# Patient Record
Sex: Male | Born: 1990 | Race: Black or African American | Hispanic: No | Marital: Single | Smoking: Current every day smoker
Health system: Southern US, Community
[De-identification: ages and names within clinical notes are randomized; demographics above are authoritative.]

## PROBLEM LIST (undated history)

## (undated) DIAGNOSIS — F32A Depression, unspecified: Secondary | ICD-10-CM

## (undated) DIAGNOSIS — F419 Anxiety disorder, unspecified: Secondary | ICD-10-CM

## (undated) DIAGNOSIS — F329 Major depressive disorder, single episode, unspecified: Secondary | ICD-10-CM

## (undated) HISTORY — DX: Anxiety disorder, unspecified: F41.9

---
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
## 2010-11-29 ENCOUNTER — Emergency Department (HOSPITAL_COMMUNITY): Payer: PRIVATE HEALTH INSURANCE

## 2010-11-29 ENCOUNTER — Emergency Department (HOSPITAL_COMMUNITY)
Admission: EM | Admit: 2010-11-29 | Discharge: 2010-11-30 | Disposition: A | Discharge status: Home / Self Care | Payer: PRIVATE HEALTH INSURANCE | Attending: Emergency Medicine | Admitting: Emergency Medicine

## 2010-11-29 ENCOUNTER — Encounter: Payer: Self-pay | Admitting: *Deleted

## 2010-11-29 DIAGNOSIS — J45909 Unspecified asthma, uncomplicated: Secondary | ICD-10-CM | POA: Insufficient documentation

## 2010-11-29 DIAGNOSIS — R109 Unspecified abdominal pain: Secondary | ICD-10-CM | POA: Insufficient documentation

## 2010-11-29 DIAGNOSIS — K5900 Constipation, unspecified: Secondary | ICD-10-CM | POA: Insufficient documentation

## 2010-11-29 NOTE — ED Notes (Signed)
Patient transported to X-ray 

## 2010-11-29 NOTE — ED Notes (Signed)
The pt has not had a bm since the middle of September.  He has tried laxatives without relief.  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

## 2010-11-29 NOTE — ED Notes (Signed)
ASSUMED CARE ON PT. INTRODUCED SELF , CALL LIGHT WITHIN REACH , BEDRAILS UP , WARM BLANKETS PROVIDED , WAITING FOR EDP/PA EVALUATION , EXPLAINED PROCESS/DELAY .

## 2010-11-30 MED ORDER — DOCUSATE SODIUM 100 MG PO CAPS: 100.0000 mg | ORAL_CAPSULE | Freq: Two times a day (BID) | ORAL | Status: AC

## 2010-11-30 MED ORDER — MAGNESIUM CITRATE PO SOLN: 296.0000 mL | Freq: Once | ORAL | Status: AC

## 2010-11-30 MED ORDER — POLYETHYLENE GLYCOL 3350 17 GM/SCOOP PO POWD: 17.0000 g | Freq: Two times a day (BID) | ORAL | Status: AC

## 2010-11-30 NOTE — ED Notes (Signed)
MD at bedside. 

## 2010-11-30 NOTE — ED Provider Notes (Signed)
History     CSN: [id number] Arrival date & time: 11/29/2010  6:48 PM   First MD Initiated Contact with Patient 11/29/10 2340      Chief Complaint  Patient presents with  . Abdominal Pain    (Consider location/radiation/quality/duration/timing/severity/associated sxs/prior treatment) HPI Comments: Symptoms were gradual in onset Sx are mild to moderate Sx are gradually getting worse, No associated f/c/n/v.  Has mild abd pain.   Constipation is not a new problem for him.  Admits to small hard stools when he has them.  Deneis rectal bleeding, vomiting or nausea or abd distention.  Patient is a 20 y.o. male presenting with abdominal pain. The history is provided by the patient and a relative.  Abdominal Pain The primary symptoms of the illness include abdominal pain. The primary symptoms of the illness do not include fever, fatigue, shortness of breath, nausea, vomiting, diarrhea, hematemesis, hematochezia or dysuria. Episode onset: several months ago. The onset of the illness was gradual. The problem has been gradually worsening.  Associated with: not using opiates - states has had "bowel problems" since infancy. Additional symptoms associated with the illness include constipation. Symptoms associated with the illness do not include chills, anorexia, diaphoresis, urgency, hematuria, frequency or back pain.    Past Medical History  Diagnosis Date  . Asthma     History reviewed. No pertinent past surgical history.  History reviewed. No pertinent family history.  History  Substance Use Topics  . Smoking status: Current Everyday Smoker  . Smokeless tobacco: Not on file  . Alcohol Use: Yes      Review of Systems  Constitutional: Negative for fever, chills, diaphoresis and fatigue.  Respiratory: Negative for shortness of breath.   Gastrointestinal: Positive for abdominal pain and constipation. Negative for nausea, vomiting, diarrhea, hematochezia, anorexia and hematemesis.    Genitourinary: Negative for dysuria, urgency, frequency and hematuria.  Musculoskeletal: Negative for back pain.  All other systems reviewed and are negative.    Allergies  Review of patient's allergies indicates no known allergies.  Home Medications   Current Outpatient Rx  Name Route Sig Dispense Refill  . DOCUSATE SODIUM 100 MG PO CAPS Oral Take 1 capsule (100 mg total) by mouth every 12 (twelve) hours. 30 capsule 0  . MAGNESIUM CITRATE PO SOLN Oral Take 296 mLs by mouth once. OTC 300 mL 0  . POLYETHYLENE GLYCOL 3350 PO POWD Oral Take 17 g by mouth 2 (two) times daily. Until daily soft stools  OTC 255 g 0    BP 127/75  Pulse 69  Temp(Src) 98.1 F (36.7 C) (Oral)  Resp 20  SpO2 97%  Physical Exam  Nursing note and vitals reviewed. Constitutional: He appears well-developed and well-nourished. No distress.  HENT:  Head: Normocephalic and atraumatic.  Mouth/Throat: Oropharynx is clear and moist. No oropharyngeal exudate.  Eyes: Conjunctivae and EOM are normal. Pupils are equal, round, and reactive to light. Right eye exhibits no discharge. Left eye exhibits no discharge. No scleral icterus.  Neck: Normal range of motion. Neck supple. No JVD present. No thyromegaly present.  Cardiovascular: Normal rate, regular rhythm, normal heart sounds and intact distal pulses.  Exam reveals no gallop and no friction rub.   No murmur heard. Pulmonary/Chest: Effort normal and breath sounds normal. No respiratory distress. He has no wheezes. He has no rales.  Abdominal: Soft. He exhibits no distension and no mass. There is tenderness ( bmild L sided ttp, no guarding, non peritoneal, no masses felt, no RUQ or RLQ  ttpl). There is no rebound and no guarding.       BS decreased  Musculoskeletal: Normal range of motion. He exhibits no edema and no tenderness.  Lymphadenopathy:    He has no cervical adenopathy.  Neurological: He is alert. Coordination normal.  Skin: Skin is warm and dry. No  rash noted. No erythema.  Psychiatric: He has a normal mood and affect. His behavior is normal.    ED Course  Procedures (including critical care time)  Labs Reviewed - No data to display Dg Abd 1 View  11/29/2010  *RADIOLOGY REPORT*  Clinical Data: Severe constipation, lower abdominal pain.  ABDOMEN - 1 VIEW  Comparison: None.  Findings: Large stool burden.  Nonobstructive bowel gas pattern. The hemidiaphragms are excluded from the image as are the lung bases.  The visualized organ outlines are normal where seen.  No acute osseous abnormality.  IMPRESSION: Large stool burden.  Nonobstructive bowel gas pattern.  Original Report Authenticated By: Waneta Martins, M.D.     1. Constipation       MDM  Well appearing male with non obstructive bowel gas pattern, and large stool burden on xray, VS normal and well appearing with chronic dysmotility of the bowel.  Has declined rectal exam to alleviate possible disimpaction - agreeable to outpaitne therapy with colace, miralax and magnesium citrate.  F/u instructions given.        Vida Roller, MD 11/30/10 [phone]

## 2015-09-17 ENCOUNTER — Ambulatory Visit
Admission: EM | Admit: 2015-09-17 | Discharge: 2015-09-17 | Disposition: A | Discharge status: Home / Self Care | Payer: Worker's Compensation | Attending: Family Medicine | Admitting: Family Medicine

## 2015-09-17 ENCOUNTER — Encounter: Payer: Self-pay | Admitting: *Deleted

## 2015-09-17 DIAGNOSIS — S61512A Laceration without foreign body of left wrist, initial encounter: Secondary | ICD-10-CM | POA: Diagnosis not present

## 2015-09-17 MED ORDER — TETANUS-DIPHTH-ACELL PERTUSSIS 5-2.5-18.5 LF-MCG/0.5 IM SUSP
0.5000 mL | Freq: Once | INTRAMUSCULAR | Status: AC
  Administered 2015-09-17: 0.5 mL via INTRAMUSCULAR

## 2015-09-17 MED ORDER — LIDOCAINE HCL (PF) 1 % IJ SOLN: 5.0000 mL | Freq: Once | INTRAMUSCULAR | Status: DC

## 2015-09-17 NOTE — ED Provider Notes (Signed)
[facility] ____________________________________________  Time seen: Approximately 4:10 PM  I have reviewed the triage vital signs and the nursing notes.   HISTORY  Chief Complaint  Laceration  HPI Lance Park is a 25 y.o. male presents for the complaint of left wrist laceration. 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 Patient reports he was doing a twisting motion with his left wrist, and his left wrist turned forward causing his medial wrist to hit a piece of metal. Reports this then caused a laceration. Patient reports it was a quick glancing cut. Denies any crush injuries. Patient states mild burning sensation directly at laceration site but denies any other pain or discomfort. Patient reports he is right-handed. Denies any other pain or injury. Denies concerns or foreign bodies. Unsure of last tetanus immunization.  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 Denies fall. Denies head injury or loss of consciousness.   Past Medical History:  Diagnosis Date  . Asthma     There are no active problems to display for this patient.   History reviewed. No pertinent surgical history.    No current facility-administered medications for this encounter.  No current outpatient prescriptions on file.  Allergies Review of patient's allergies indicates no known allergies.  History reviewed. No pertinent family history.  Social History Social History  Substance Use Topics  . Smoking status: Current Every Day Smoker  . Smokeless tobacco: Never Used  . Alcohol use No    Review of Systems Constitutional: No fever/chills Eyes: No visual changes. ENT: No sore throat. Cardiovascular: Denies chest pain. Respiratory: Denies shortness of breath. Gastrointestinal: No abdominal pain.  No nausea, no vomiting.  No diarrhea.  No constipation. Genitourinary: Negative for dysuria. Musculoskeletal: Negative for back pain. Skin:  Negative for rash. Neurological: Negative for headaches, focal weakness or numbness.  10-point ROS otherwise negative.  ____________________________________________   PHYSICAL EXAM:  VITAL SIGNS: ED Triage Vitals  Enc Vitals Group     BP 09/17/15 1556 131/69     Pulse Rate 09/17/15 1556 69     Resp 09/17/15 1556 18     Temp 09/17/15 1556 98.7 F (37.1 C)     Temp Source 09/17/15 1556 Oral     SpO2 09/17/15 1556 100 %     Weight 09/17/15 1559 145 lb (65.8 kg)     Height 09/17/15 1559 5\' 9"  (1.753 m)     Head Circumference --      Peak Flow --      Pain Score 09/17/15 1600 1     Pain Loc --      Pain Edu? --      Excl. in GC? --     Constitutional: Alert and oriented. Well appearing and in no acute distress. Eyes: Conjunctivae are normal. PERRL. EOMI. ENT      Head: Normocephalic and atraumatic.      Mouth/Throat: Mucous membranes are moist. Cardiovascular: Normal rate, regular rhythm. Grossly normal heart sounds.  Good peripheral circulation. Respiratory: Normal respiratory effort without tachypnea nor retractions. Breath sounds are clear and equal bilaterally. No wheezes/rales/rhonchi.. Musculoskeletal:  Ambulatory with steady gait. Neurologic:  Normal speech and language. No gross focal neurologic deficits are appreciated. Speech is normal. No gait instability.  Skin:  Skin is warm, dry and intact. No rash noted. Except: left wrist volar lateral radial aspect 3.5 cm linear laceration present, laceration superficial, no tendon visualized, no active bleeding, nontender, no foreign bodies visualized. Left  hand and wrist with full range of motion,no bony tenderness, no motor or tendon deficit, normal distal capillary refill time, bilateral distal radial pulses equal.  Psychiatric: Mood and affect are normal. Speech and behavior are normal. Patient exhibits appropriate insight and judgment   ___________________________________________   LABS (all labs ordered are listed, but  only abnormal results are displayed)  Labs Reviewed - No data to display ____________________________________________   PROCEDURES Procedures  Procedure(s) performed:  Procedure explained and verbal consent obtained. Consent: Verbal consent obtained. Written consent not obtained. Risks and benefits: risks, benefits and alternatives were discussed Patient identity confirmed: verbally with patient and hospital-assigned identification number  Consent given by: patient   Laceration Repair Location: Left wrist Length: 3.5 cm Foreign bodies: no foreign bodies visualized. Tendon involvement: none Nerve involvement: none Preparation: Patient was prepped and draped in the usual sterile fashion. Anesthesia with 1% Lidocaine 4 mls Irrigation solution: saline and betadine Irrigation method: jet lavage Amount of cleaning: copious Repaired with 4-0 nylon  Number of sutures: 6 Technique: simple interrupted  Approximation: loose Patient tolerate well. Wound well approximated post repair.  Antibiotic ointment and dressing applied.  Wound care instructions provided.  Observe for any signs of infection or other problems.     ____________________________________________   INITIAL IMPRESSION / ASSESSMENT AND PLAN / ED COURSE  Pertinent labs & imaging results that were available during my care of the patient were reviewed by me and considered in my medical decision making (see chart for details).  Well-appearing patient. No acute distress. Presents for the complaints of left wrist laceration sustained just prior to arrival, at work. Reports mechanical injury. No bony tenderness. Wound cleaned and repaired. Patient tolerated well. Tetanus immunization updated. Patient directed in wound care and wound monitoring. Directed to return to urgent care in 7-10 days for suture removal.  Discussed follow up with Primary care physician this week. Discussed follow up and return parameters including no  resolution or any worsening concerns. Patient verbalized understanding and agreed to plan.   ____________________________________________   FINAL CLINICAL IMPRESSION(S) / ED DIAGNOSES  Final diagnoses:  Laceration of left wrist, initial encounter     New Prescriptions   No medications on file    Note: This dictation was prepared with Dragon dictation along with smaller phrase technology. Any transcriptional errors that result from this process are unintentional.    Clinical Course      Renford Dills, NP 09/17/15 1700

## 2015-09-17 NOTE — Discharge Instructions (Signed)
Keep clean and dry. Clean daily with soap and water as discussed.   Return in urgent care in 7-10 days for suture removal.Return sooner for any redness, swelling, pain or other concerns.    Follow up with your primary care physician this week as needed. Return to Urgent care for new or worsening concerns.

## 2015-09-17 NOTE — ED Triage Notes (Signed)
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

## 2015-09-26 ENCOUNTER — Encounter: Payer: Self-pay | Admitting: *Deleted

## 2015-09-26 ENCOUNTER — Ambulatory Visit
Admission: EM | Admit: 2015-09-26 | Discharge: 2015-09-26 | Disposition: A | Discharge status: Home / Self Care | Payer: Worker's Compensation

## 2015-09-26 NOTE — ED Triage Notes (Signed)
Patient has arrived for suture removal. Laceration is well healed with no redness, swelling, or drainage.  6 sutures removed.

## 2016-07-07 ENCOUNTER — Encounter (HOSPITAL_COMMUNITY): Payer: Self-pay | Admitting: *Deleted

## 2016-07-07 ENCOUNTER — Inpatient Hospital Stay (HOSPITAL_COMMUNITY)
Admission: AD | Admit: 2016-07-07 | Discharge: 2016-07-12 | DRG: 885 | Disposition: A | Discharge status: Home / Self Care | Payer: Federal, State, Local not specified - Other | Source: Intra-hospital | Attending: Psychiatry | Admitting: Psychiatry

## 2016-07-07 ENCOUNTER — Emergency Department (HOSPITAL_COMMUNITY)
Admission: EM | Admit: 2016-07-07 | Discharge: 2016-07-07 | Disposition: A | Discharge status: Other Acute Inpatient Hospital | Payer: Self-pay | Attending: Emergency Medicine | Admitting: Emergency Medicine

## 2016-07-07 ENCOUNTER — Encounter (HOSPITAL_COMMUNITY): Payer: Self-pay

## 2016-07-07 DIAGNOSIS — Z915 Personal history of self-harm: Secondary | ICD-10-CM

## 2016-07-07 DIAGNOSIS — F322 Major depressive disorder, single episode, severe without psychotic features: Secondary | ICD-10-CM | POA: Insufficient documentation

## 2016-07-07 DIAGNOSIS — Z79899 Other long term (current) drug therapy: Secondary | ICD-10-CM | POA: Insufficient documentation

## 2016-07-07 DIAGNOSIS — F17210 Nicotine dependence, cigarettes, uncomplicated: Secondary | ICD-10-CM | POA: Diagnosis present

## 2016-07-07 DIAGNOSIS — R45851 Suicidal ideations: Secondary | ICD-10-CM | POA: Diagnosis not present

## 2016-07-07 DIAGNOSIS — F1290 Cannabis use, unspecified, uncomplicated: Secondary | ICD-10-CM | POA: Diagnosis present

## 2016-07-07 DIAGNOSIS — F411 Generalized anxiety disorder: Secondary | ICD-10-CM | POA: Insufficient documentation

## 2016-07-07 DIAGNOSIS — G4700 Insomnia, unspecified: Secondary | ICD-10-CM | POA: Diagnosis not present

## 2016-07-07 DIAGNOSIS — F17200 Nicotine dependence, unspecified, uncomplicated: Secondary | ICD-10-CM | POA: Insufficient documentation

## 2016-07-07 DIAGNOSIS — J45909 Unspecified asthma, uncomplicated: Secondary | ICD-10-CM | POA: Insufficient documentation

## 2016-07-07 HISTORY — DX: Depression, unspecified: F32.A

## 2016-07-07 HISTORY — DX: Major depressive disorder, single episode, unspecified: F32.9

## 2016-07-07 LAB — RAPID URINE DRUG SCREEN, HOSP PERFORMED
Amphetamines: NOT DETECTED
Barbiturates: NOT DETECTED
Benzodiazepines: NOT DETECTED
Cocaine: NOT DETECTED
Opiates: NOT DETECTED
Tetrahydrocannabinol: POSITIVE — AB

## 2016-07-07 LAB — ACETAMINOPHEN LEVEL: Acetaminophen (Tylenol), Serum: <10 ug/mL — ABNORMAL LOW (ref 10–30)

## 2016-07-07 LAB — COMPREHENSIVE METABOLIC PANEL
ALT: 24 U/L (ref 17–63)
AST: 23 U/L (ref 15–41)
Albumin: 4.5 g/dL (ref 3.5–5.0)
Alkaline Phosphatase: 76 U/L (ref 38–126)
Anion gap: 6 (ref 5–15)
BUN: 10 mg/dL (ref 6–20)
CO2: 27 mmol/L (ref 22–32)
Calcium: 9 mg/dL (ref 8.9–10.3)
Chloride: 105 mmol/L (ref 101–111)
Creatinine, Ser: 0.99 mg/dL (ref 0.61–1.24)
GFR calc Af Amer: >60 mL/min (ref >60)
GFR calc non Af Amer: >60 mL/min (ref >60)
Glucose, Bld: 95 mg/dL (ref 65–99)
Potassium: 4.1 mmol/L (ref 3.5–5.1)
Sodium: 138 mmol/L (ref 135–145)
Total Bilirubin: 0.6 mg/dL (ref 0.3–1.2)
Total Protein: 7.6 g/dL (ref 6.5–8.1)

## 2016-07-07 LAB — CBC
HCT: 44.1 % (ref 39.0–52.0)
Hemoglobin: 15.2 g/dL (ref 13.0–17.0)
MCH: 26.2 pg (ref 26.0–34.0)
MCHC: 34.5 g/dL (ref 30.0–36.0)
MCV: 75.9 fL — ABNORMAL LOW (ref 78.0–100.0)
Platelets: 211 10*3/uL (ref 150–400)
RBC: 5.81 MIL/uL (ref 4.22–5.81)
RDW: 13.7 % (ref 11.5–15.5)
WBC: 6.9 10*3/uL (ref 4.0–10.5)

## 2016-07-07 LAB — ETHANOL: Alcohol, Ethyl (B): <5 mg/dL (ref <5)

## 2016-07-07 LAB — SALICYLATE LEVEL: Salicylate Lvl: <7 mg/dL (ref 2.8–30.0)

## 2016-07-07 MED ORDER — ACETAMINOPHEN 325 MG PO TABS: 650.0000 mg | ORAL_TABLET | Freq: Four times a day (QID) | ORAL | Status: DC | PRN

## 2016-07-07 MED ORDER — HYDROXYZINE HCL 25 MG PO TABS
25.0000 mg | ORAL_TABLET | Freq: Three times a day (TID) | ORAL | Status: DC | PRN
  Filled 2016-07-07 (×2): qty 1

## 2016-07-07 MED ORDER — ALUM & MAG HYDROXIDE-SIMETH 200-200-20 MG/5ML PO SUSP: 30.0000 mL | ORAL | Status: DC | PRN

## 2016-07-07 MED ORDER — MAGNESIUM HYDROXIDE 400 MG/5ML PO SUSP: 30.0000 mL | Freq: Every day | ORAL | Status: DC | PRN

## 2016-07-07 MED ORDER — NICOTINE POLACRILEX 2 MG MT GUM: 2.0000 mg | CHEWING_GUM | OROMUCOSAL | Status: DC | PRN

## 2016-07-07 MED ORDER — TRAZODONE HCL 50 MG PO TABS
50.0000 mg | ORAL_TABLET | Freq: Every evening | ORAL | Status: DC | PRN
  Filled 2016-07-07: qty 1

## 2016-07-07 NOTE — Progress Notes (Signed)
Lance Park is a 26 year old male being admitted voluntarily to [facility] from [facility].  He came for evaluation for suicidal ideation. His ex-girlfriend noticed that he was googling lethal doses of oxycodone and then found his mother prescription in his pocket.  She called mobile crisis for evaluation.  Recent stressors are loss of his job about 2-3 weeks ago, finances, and a breakup with his girlfriend. He reported that he "feels that he has a burden on everyone in his life and that he would be better off dead". He denied HI or A/V hallucinations. He has a history of 2 suicide attempts in the past.  He reported also feeling anxiety with panic attacks at least 2-3 times per week.  He denies any medical issues.  He is diagnosed with Generalized Anxiety Disorder, MDD without psychotic features.  He denies any SI and will contract for safety on the unit.  He also denies HI or A/V hallucinations. Oriented him to the unit.  Admission paperwork completed and signed.  Belongings searched and secured in locker # 26.  Skin assessment completed and no skin issues noted except multiple tattoos.  Q 15 minute checks initiated for safety.  We will monitor the progress towards his goals.

## 2016-07-07 NOTE — ED Notes (Signed)
Pt calm and cooperative. Patient belonging given to [facility] transportation personnel.

## 2016-07-07 NOTE — Progress Notes (Signed)
Wrier introduced self to Pt. Pt. is seen in room reading a handout. Pt. appears anxious but is cooperative with assessment. Pt. denies SI/HI/AVH/Pain. PRNs are not needed at this time. Fluids and snacks offered, Pt. refused. Will follow POC.
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
## 2016-07-07 NOTE — ED Notes (Signed)
Bed: [facility] Expected date:  Expected time:  Means of arrival:  Comments: 

## 2016-07-07 NOTE — Progress Notes (Signed)
Pt did not attend group. 

## 2016-07-07 NOTE — ED Notes (Signed)
Pt admitted to room #39. Pt pleasant on approach. Pt reports "depression and anxiety" brought him to the hospital. Pt currently denies SI/HI/AVH. Pt identifies recent loss of a job and finances as current stressors. Pt expressing hopelessness. Encouragement and support provided. Special checks q 15 mins in place for safety, Video monitoring in place. Will continue to monitor.

## 2016-07-07 NOTE — ED Notes (Signed)
Pt calm and cooperative on approach. Pt reports history of depression but has never taken any medication. Pt did reports seeing a counselor but not recently. Pt reports stressor as recent job loss. Pt does minimize the reason he is here and reports he is not open to taking medications. Pt denies SI/HI/AVH and pain.

## 2016-07-07 NOTE — Tx Team (Signed)
Initial Treatment Plan 07/07/2016 10:05 PM Lance L Holsworth [id number]    PATIENT STRESSORS: Financial difficulties Loss of Job Substance abuse   PATIENT STRENGTHS: Wellsite geologistCommunication skills General fund of knowledge Motivation for treatment/growth Physical Health Supportive family/friends   PATIENT IDENTIFIED PROBLEMS: Depression  Anxiety  Suicidal ideation  "Learn how to deal with my depression and anxiety"  "Learn some coping skills"             DISCHARGE CRITERIA:  Improved stabilization in mood, thinking, and/or behavior Motivation to continue treatment in a less acute level of care Verbal commitment to aftercare and medication compliance  PRELIMINARY DISCHARGE PLAN: Outpatient therapy Medication management  PATIENT/FAMILY INVOLVEMENT: This treatment plan has been presented to and reviewed with the patient, Lance Park.  The patient and family have been given the opportunity to ask questions and make suggestions.  Levin BaconHeather V Maurice Ramseur, RN 07/07/2016, 10:05 PM
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
## 2016-07-07 NOTE — ED Triage Notes (Signed)
Patient is alert and oriented x4.  He is being seen for increasing SI.  Patient friend called crisis hotline and the patient was given the choice of comeing to the ED voluntary or involuntary.  Patient is cooperative.  Patient friend states that the patient has always been dealing with depression and his mood fluctuates.  Currently he denies any pain.

## 2016-07-08 DIAGNOSIS — F17210 Nicotine dependence, cigarettes, uncomplicated: Secondary | ICD-10-CM

## 2016-07-08 DIAGNOSIS — F322 Major depressive disorder, single episode, severe without psychotic features: Principal | ICD-10-CM

## 2016-07-08 DIAGNOSIS — R45851 Suicidal ideations: Secondary | ICD-10-CM

## 2016-07-08 DIAGNOSIS — G4700 Insomnia, unspecified: Secondary | ICD-10-CM

## 2016-07-08 LAB — LIPID PANEL
Cholesterol: 180 mg/dL (ref 0–200)
HDL: 48 mg/dL (ref >40)
LDL Cholesterol: 118 mg/dL — ABNORMAL HIGH (ref 0–99)
Total CHOL/HDL Ratio: 3.8 RATIO
Triglycerides: 69 mg/dL (ref <150)
VLDL: 14 mg/dL (ref 0–40)

## 2016-07-08 LAB — TSH: TSH: 1.449 u[IU]/mL (ref 0.350–4.500)

## 2016-07-08 MED ORDER — CITALOPRAM HYDROBROMIDE 10 MG PO TABS
10.0000 mg | ORAL_TABLET | Freq: Every day | ORAL | Status: DC
  Administered 2016-07-08 – 2016-07-09 (×2): 10 mg via ORAL
  Filled 2016-07-08 (×5): qty 1

## 2016-07-08 NOTE — [facility] Suicide Risk Assessment (Addendum)
[facility] INPATIENT:  Family/Significant Other Suicide Prevention Education  Suicide Prevention Education:  Education Completed; Lance Park, Pt's ex-girlfriend [phone], has been identified by the patient as the family member/significant other with whom the patient will be residing, and identified as the person(s) who will aid the patient in the event of a mental health crisis (suicidal ideations/suicide attempt).  With written consent from the patient, the family member/significant other has been provided the following suicide prevention education, prior to the and/or following the discharge of the patient.  The suicide prevention education provided includes the following:  Suicide risk factors  Suicide prevention and interventions  National Suicide Hotline telephone number  [facility] assessment telephone number  [facility] Emergency Assistance 911  [facility] and/or Residential Mobile Crisis Unit telephone number  Request made of family/significant other to:  Remove weapons (e.g., guns, rifles, knives), all items previously/currently identified as safety concern.    Remove drugs/medications (over-the-counter, prescriptions, illicit drugs), all items previously/currently identified as a safety concern.  The family member/significant other verbalizes understanding of the suicide prevention education information provided.  The family member/significant other agrees to remove the items of safety concern listed above.  Pt's ex-girlfriend reports being supportive of Pt, however feels that Pt is often manipulative when his anxiety and depression symptoms are high as he is unable to cope in healthy ways.   Lance Park 07/08/2016, 2:23 PM
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
## 2016-07-08 NOTE — Progress Notes (Signed)
  Data. Patient denies SI/HI/AVH. Patient interacting well with staff and other patients. Affect is bright with staff and peers. He is a little hesitant/cautious with staff. He states, "I really want to get the help I need and then get out of here." Patient did state, "I don't really know why I am here, I don't think I need to be", so some ambivalence noted. We did discuss the medication the doctor had started him on and he received a handout. Also began to explore ways of changing thought patterns. Action. Emotional support and encouragement offered. Education provided on medication, indications and side effect. Q 15 minute checks done for safety. Response. Safety on the unit maintained through 15 minute checks.  Medications taken as prescribed. Attended groups. Remained calm and appropriate through out shift.

## 2016-07-08 NOTE — Progress Notes (Signed)
Recreation Therapy Notes  Date: 07/08/16 Time: 0930 Location: 300 Hall Dayroom  Group Topic: Stress Management  Goal Area(s) Addresses:  Patient will verbalize importance of using healthy stress management.  Patient will identify positive emotions associated with healthy stress management.   Intervention: Stress Management  Activity :  Progressive Muscle Relaxation.  LRT introduced the stress management technique of progressive muscle relaxation.  LRT read Park script that guided patients through steps of tensing and relaxing each muscle group.  Patients were to follow along as LRT read script to fully participate in the technique.  Education:  Stress Management, Discharge Planning.   Education Outcome: Acknowledges edcuation/In group clarification offered/Needs additional education  Clinical Observations/Feedback: Pt did not attend group.   Jazlyne Gauger, LRT/CTRS         Lance Park 07/08/2016 11:27 AM 

## 2016-07-08 NOTE — Plan of Care (Signed)
Problem: Activity: Goal: Imbalance in normal sleep/wake cycle will improve Outcome: Progressing Pateint reports, "Slet well last night", And has not napped during the day.
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
## 2016-07-08 NOTE — Plan of Care (Signed)
Problem: Coping: Goal: Ability to demonstrate self-control will improve Outcome: Progressing PAtient has remained calm and appropriate in behavior, throughout shift.
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
## 2016-07-08 NOTE — [facility] Group Notes (Signed)
[facility] LCSW Group Therapy 07/08/2016 1:15pm  Type of Therapy: Group Therapy- Feelings Around Relapse and Recovery  Participation Level: Active   Participation Quality:  Appropriate  Affect:  Appropriate  Cognitive: Alert and Oriented   Insight:  Developing   Engagement in Therapy: Developing/Improving and Engaged   Modes of Intervention: Clarification, Confrontation, Discussion, Education, Exploration, Limit-setting, Orientation, Problem-solving, Rapport Building, Dance movement psychotherapisteality Testing, Socialization and Support  Summary of Progress/Problems: The topic for today was feelings about relapse. The group discussed what relapse prevention is to them and identified triggers that they are on the path to relapse. Members also processed their feeling towards relapse and were able to relate to common experiences. Group also discussed coping skills that can be used for relapse prevention. Pt states that he struggles with smoking weed as a way to self medicate his depression and anxiety. Pt states that part of his relapse prevention plan is being put on the right medications so that he will no longer have to self medicate with weed.   Therapeutic Modalities:   Cognitive Behavioral Therapy Solution-Focused Therapy Assertiveness Training Relapse Prevention Therapy    Donnelly StagerLynn Jabriel Vanduyne, MSW, Theresia MajorsLCSWA [phone] 07/08/2016 3:04 PM

## 2016-07-09 DIAGNOSIS — F1290 Cannabis use, unspecified, uncomplicated: Secondary | ICD-10-CM

## 2016-07-09 LAB — HEMOGLOBIN A1C
Hgb A1c MFr Bld: 5 % (ref 4.8–5.6)
Mean Plasma Glucose: 97 mg/dL

## 2016-07-09 MED ORDER — CITALOPRAM HYDROBROMIDE 20 MG PO TABS
20.0000 mg | ORAL_TABLET | Freq: Every day | ORAL | Status: DC
  Administered 2016-07-10 – 2016-07-12 (×3): 20 mg via ORAL
  Filled 2016-07-09 (×4): qty 1
  Filled 2016-07-09: qty 7

## 2016-07-09 NOTE — Progress Notes (Signed)
Patient attended group and said that his day was a 9. His coping skills were coloring, exercising and taking showers.

## 2016-07-09 NOTE — Progress Notes (Signed)
Writer has observed patient up in the dayroom  Off and on tonight interacting with select peers. He was also seen with Herbert SetaHeather A doing exercises up and down the hallway. He reports that he had energy that he needed to burn off. He reports that his day was ok until it was visitation time and no one came to see him. He reports that his family feels that he is pretending and that their is nothing wrong with him. Writer encouraged him to focus on himself and getting the help he needs. Support given and safety maintained on unit with 15 min checks.

## 2016-07-09 NOTE — Plan of Care (Signed)
Problem: Activity: Goal: Interest or engagement in leisure activities will improve Outcome: Progressing Patient hs attended every unit activity this shift.

## 2016-07-09 NOTE — Progress Notes (Signed)
Data. Patient denies SI/HI/AVH. Patient is, however, reporting he has increased depression this AM, "Because no one came to visit me last night. They just think that I am making this up. They don't understand." patient did talk at length about his career plans and traveling the world, "To see how other people live."  Patient interacting well with staff and other patients. Affect was very flat at the beginning of the shift, but has brightened as the day has advanced.  Action. Emotional support and encouragement offered. Education provided on medication, indications and side effect. Q 15 minute checks done for safety. Response. Safety on the unit maintained through 15 minute checks.  Medications taken as prescribed. Attended groups. Remained calm and appropriate through out shift.

## 2016-07-09 NOTE — Plan of Care (Signed)
Problem: Coping: Goal: Ability to cope will improve Outcome: Progressing Patient was able to be angry, this shift, and maintain appropriate behavior, and have SI.

## 2016-07-09 NOTE — [facility] Group Notes (Signed)
Adult Therapy Group Note (Clinical Social Work)  Date:  07/09/2016  Time:  10:00-11:00AM  Group Topic/Focus:  HEALTHY COPING SKILLS  Today's group focused on identifying healthy coping skills already in use by each patient, as well as healthy coping skills they would like to learn.  There was much sharing, support, psychoeducation, and encouragement provided.  Participation Level:  Minimal  Participation Quality:  Attentive  Affect:  Appropriate  Cognitive:  Alert  Insight: Improving  Engagement in Group:  Limited  Modes of Intervention:  Discussion, Support and Processing  Additional Comments:  The patient shared little in group, was in and out a number of times without explanation, listened but did not talk hardly at all.  Carloyn JaegerMareida J Grossman-Orr 06/11/2016, 1:28 PM

## 2016-07-09 NOTE — Progress Notes (Signed)
[facility]  07/09/2016 11:14 AM [name] Rod[place]  MRN:  [id number] Subjective:  Patient reports "I am feeling frustrated today, I feel like I am ready to leave."  Objective: [name] Coll is awake, alert and oriented. Patient reports attending group session. Reports he is feeling ready to discharge and states he has been here for the past 2 day." I am not feeling too high or too low.".  Denies suicidal or homicidal ideation. Denies auditory or visual hallucination and does not appear to be responding to internal stimuli. Patient interacts well with staff and others. Patient reports he is medication compliant with the Celexa without side effects.   Reports good appetite and reports he is resting well. Support, encouragement and reassurance was provided.   Principal Problem: Severe major depression without psychotic features (HCC) Diagnosis:   Patient Active Problem List   Diagnosis Date Noted  . Severe major depression without psychotic features (HCC) [F32.2] 07/07/2016   Total Time spent with patient: 30 minutes  Past Psychiatric History:   Past Medical History:  Past Medical History:  Diagnosis Date  . Asthma    "As a child, I grew out of it"  . Depression    History reviewed. No pertinent surgical history. Family History: History reviewed. No pertinent family history. Family Psychiatric  History:  Social History:  History  Alcohol Use No     History  Drug Use  . Types: Marijuana    Social History   Social History  . Marital status: Single    Spouse name: N/A  . Number of children: N/A  . Years of education: N/A   Social History Main Topics  . Smoking status: Current Every Day Smoker    Packs/day: 0.50    Types: Cigarettes  . Smokeless tobacco: Never Used  . Alcohol use No  . Drug use: Yes    Types: Marijuana  . Sexual activity: Yes   Other Topics Concern  . None   Social History Narrative  . None   Additional Social History:    Pain Medications:  denies Prescriptions: denies Over the Counter: denies History of alcohol / drug use?: Yes Name of Substance 1: marijuana 1 - Age of First Use: 14 1 - Amount (size/oz): 1 blunt or 2 1 - Frequency: daily 1 - Duration: 10 years 1 - Last Use / Amount: last night                  Sleep: Fair  Appetite:  Fair  Current Medications: Current Facility-Administered Medications  Medication Dose Route Frequency Provider Last Rate Last Dose  . acetaminophen (TYLENOL) tablet 650 mg  650 mg Oral Q6H PRN Jackelyn PolingBerry, Jason A, NP      . alum & mag hydroxide-simeth (MAALOX/MYLANTA) 200-200-20 MG/5ML suspension 30 mL  30 mL Oral Q4H PRN Nira ConnBerry, Jason A, NP      . citalopram (CELEXA) tablet 10 mg  10 mg Oral Daily Cobos, Rockey SituFernando A, MD   10 mg at 07/09/16 [id number]  . hydrOXYzine (ATARAX/VISTARIL) tablet 25 mg  25 mg Oral TID PRN Jackelyn PolingBerry, Jason A, NP      . magnesium hydroxide (MILK OF MAGNESIA) suspension 30 mL  30 mL Oral Daily PRN Nira ConnBerry, Jason A, NP      . nicotine polacrilex (NICORETTE) gum 2 mg  2 mg Oral PRN Cobos, Rockey SituFernando A, MD      . traZODone (DESYREL) tablet 50 mg  50 mg Oral QHS PRN Jackelyn PolingBerry, Jason A,  NP        Lab Results:  Results for orders placed or performed during the hospital encounter of 07/07/16 (from the past 48 hour(s))  Hemoglobin A1c     Status: None   Collection Time: 07/08/16  6:08 AM  Result Value Ref Range   Hgb A1c MFr Bld 5.0 4.8 - 5.6 %    Comment: (NOTE)         Pre-diabetes: 5.7 - 6.4         Diabetes: >6.4         Glycemic control for adults with diabetes: <7.0    Mean Plasma Glucose 97 mg/dL    Comment: (NOTE) Performed At: [facility] [address] [place], [place] [id number] Mila Homer MD [id number] Performed at [facility], [address], [place], [place] [address]   Lipid panel     Status: Abnormal   Collection Time: 07/08/16  6:08 AM  Result Value Ref Range   Cholesterol 180 0 - 200 mg/dL   Triglycerides 69 <308  mg/dL   HDL 48 >65 mg/dL   Total CHOL/HDL Ratio 3.8 RATIO   VLDL 14 0 - 40 mg/dL   LDL Cholesterol 784 (H) 0 - 99 mg/dL    Comment:        Total Cholesterol/HDL:CHD Risk Coronary Heart Disease Risk Table                     Men   Women  1/2 Average Risk   3.4   3.3  Average Risk       5.0   4.4  2 X Average Risk   9.6   7.1  3 X Average Risk  23.4   11.0        Use the calculated Patient Ratio above and the CHD Risk Table to determine the patient's CHD Risk.        ATP III CLASSIFICATION (LDL):  <100     mg/dL   Optimal  696-295  mg/dL   Near or Above                    Optimal  130-159  mg/dL   Borderline  284-132  mg/dL   High  >440     mg/dL   Very High Performed at [facility], [address] [place], [place] [address]   TSH     Status: None   Collection Time: 07/08/16  6:08 AM  Result Value Ref Range   TSH 1.449 0.350 - 4.500 uIU/mL    Comment: Performed by a 3rd Generation assay with a functional sensitivity of <=0.01 uIU/mL. Performed at [facility], [address] [place], [place] [address]     Blood Alcohol level:  Lab Results  Component Value Date   ETH <5 07/07/2016    Metabolic Disorder Labs: Lab Results  Component Value Date   HGBA1C 5.0 07/08/2016   MPG 97 07/08/2016   No results found for: PROLACTIN Lab Results  Component Value Date   CHOL 180 07/08/2016   TRIG 69 07/08/2016   HDL 48 07/08/2016   CHOLHDL 3.8 07/08/2016   VLDL 14 07/08/2016   LDLCALC 118 (H) 07/08/2016    Physical Findings: AIMS: Facial and Oral Movements Muscles of Facial Expression: None, normal Lips and Perioral Area: None, normal Jaw: None, normal Tongue: None, normal,Extremity Movements Upper (arms, wrists, hands, fingers): None, normal Lower (legs, knees, ankles, toes): None,  normal, Trunk Movements Neck, shoulders, hips: None, normal, Overall Severity Severity of abnormal movements (highest score from questions above): None,  normal Incapacitation due to abnormal movements: None, normal [name] awareness of abnormal movements (rate only patient's report): No Awareness, Dental Status Current problems with teeth and/or dentures?: No Does patient usually wear dentures?: No  CIWA:    COWS:     Musculoskeletal: Strength & Muscle Tone: within normal limits Gait & Station: normal Patient leans: N/A  Psychiatric Specialty Exam: Physical Exam  Vitals reviewed. Constitutional: He is oriented to person, place, and time. He appears well-developed.  HENT:  Head: Normocephalic.  Cardiovascular: Normal rate.   Neurological: He is alert and oriented to person, place, and time.  Psychiatric: He has a normal mood and affect. His behavior is normal.    Review of Systems  Psychiatric/Behavioral: Negative for depression (stable). The patient is not nervous/anxious (stable).     Blood pressure 120/80, pulse 69, temperature 98.3 F (36.8 C), temperature source Oral, resp. rate 18, height 5' 8.5" (1.74 m), weight 63 kg (139 lb).Body mass index is 20.83 kg/m.  General Appearance: Casual  Eye Contact:  Good  Speech:  Clear and Coherent  Volume:  Normal  Mood:  Depressed and Irritable  Affect:  Congruent and Depressed  Thought Process:  Coherent  Orientation:  Full (Time, Place, and Person)  Thought Content:  Hallucinations: None and Rumination  Suicidal Thoughts:  No  Homicidal Thoughts:  No  Memory:  Immediate;   Fair Recent;   Fair Remote;   Fair  Judgement:  Fair  Insight:  Fair  Psychomotor Activity:  Normal  Concentration:  Concentration: Good  Recall:  Fair  Fund of Knowledge:  Fair  Language:  Fair  Akathisia:  No  Handed:  Right  AIMS (if indicated):     Assets:  Communication Skills Desire for Improvement Resilience Social Support  ADL's:  Intact  Cognition:  WNL  Sleep:  Number of Hours: 6.75     I agree with current treatment plan on 07/09/2016, Patient seen face-to-face for psychiatric  evaluation follow-up, chart reviewed. Reviewed the information documented and agree with the treatment plan.  Treatment Plan Summary: Daily contact with patient to assess and evaluate symptoms and progress in treatment and Medication management   Increased with Celexa 10 mg  for mood stabilization. Continue with Trazodone 50 mg for insomnia  Will continue to monitor vitals ,medication compliance and treatment side effects while patient is here.  Reviewed labs: ,BAL - 0, UDS - pos for Thc  CSW will start working on disposition.  Patient to participate in therapeutic milieu  Oneta Rack, NP 07/09/2016, 11:14 AM  Notes reviewed and agree with plan

## 2016-07-09 NOTE — [facility] Group Notes (Signed)
[facility] Group Notes:  (Nursing/MHT/Case Management/Adjunct)  Date:  07/09/2016  Time:  5:58 PM  Type of Therapy:  Nurse Education  Participation Level:  Active  Participation Quality:  Appropriate  Affect:  Angry and Anxious  Cognitive:  Alert and Appropriate  Insight:  Appropriate  Engagement in Group:  Engaged  Modes of Intervention:  Discussion  Summary of Progress/Problems:   Group addressed coping skills related to depression and anxiety, specifically dealing with stressful situations.    Almira Barenny G Aljean Horiuchi 07/09/2016, 5:58 PM

## 2016-07-10 NOTE — Progress Notes (Signed)
D: Spoke with patient 1:1 who is up and visible in the milieu. He is participating in treatment and attending group therapies. He is observed interacting appropriately with staff and peers. [name] affect is animated with anxious but pleasant mood. Per his self inventory and through discussion with this writer, patient rates depression, hopelessness and anxiety all at a 0/10. Rates sleep as good, appetite as good, energy as normal and concentration as good.  States goal for today is to "keep my mind quiet during down time, work on word puzzles, tv, color, draw." Denies pain, physical problems.   A: Medicated per orders, no prns requested or required. Level III obs in place for safety. Emotional support offered and self inventory reviewed. Patient completed Suicide Safety Plan which was reviewed by this Clinical research associatewriter and is filed on chart. Will be given a copy upon discharge. Encouraged programming participation.    R: Patient verbalizes understanding of POC. Compliant with celexa and reports improvement. Lance Park denies SI/HI/AVH and remains safe on level III obs. Will continue to monitor closely and make verbal contact frequently.

## 2016-07-10 NOTE — Progress Notes (Signed)
  Adult Psychoeducational Group Note  Date:  07/10/2016 Time:  10:21 PM  Group Topic/Focus:  Wrap-Up Group:   The focus of this group is to help patients review their daily goal of treatment and discuss progress on daily workbooks.  Participation Level:  Active  Participation Quality:  Appropriate, Attentive, Sharing and Supportive  Affect:  Appropriate  Cognitive:  Appropriate  Insight: Appropriate and Good  Engagement in Group:  Developing/Improving  Modes of Intervention:  Discussion, Education, Socialization and Support  Additional Comments:  Pt. Shared in group that his goal is to put himself first. Pt shared he was talking to his girlfriend and has been giving it some space so he doesn't feel overwhelmed.   Karleen HampshireFox, Neyland Pettengill Brittini 07/10/2016, 10:21 PM

## 2016-07-10 NOTE — Progress Notes (Signed)
[facility]  07/10/2016 12:05 PM Lance Park  MRN:  [id number]   Subjective:  Patient reports " I am feeling so much better and I am taking my medication. I really would like to discharge on tomorrow."   Objective: Lance Park seen sitting in the dayroom interacting with peers. Patient continue to report he is improving day by day and states that he is planning to follow-up with [facility]. Patient denies suicidal or homicidal ideations during this assessment. Patient reports feeling better after taking Celexa. Reports he has been attending group sessions. Reports he is eating and sleeping okay. Patient denies depression or depressive symptoms, although he appears to be minimizing feelings. Support, encouragement and reassurance was provided.   Principal Problem: Severe major depression without psychotic features (HCC) Diagnosis:   Patient Active Problem List   Diagnosis Date Noted  . Severe major depression without psychotic features (HCC) [F32.2] 07/07/2016   Total Time spent with patient: 30 minutes  Past Psychiatric History:   Past Medical History:  Past Medical History:  Diagnosis Date  . Asthma    "As a child, I grew out of it"  . Depression    History reviewed. No pertinent surgical history. Family History: History reviewed. No pertinent family history. Family Psychiatric  History:  Social History:  History  Alcohol Use No     History  Drug Use  . Types: Marijuana    Social History   Social History  . Marital status: Single    Spouse name: N/A  . Number of children: N/A  . Years of education: N/A   Social History Main Topics  . Smoking status: Current Every Day Smoker    Packs/day: 0.50    Types: Cigarettes  . Smokeless tobacco: Never Used  . Alcohol use No  . Drug use: Yes    Types: Marijuana  . Sexual activity: Yes   Other Topics Concern  . None   Social History Narrative  . None   Additional Social History:    Pain Medications:  denies Prescriptions: denies Over the Counter: denies History of alcohol / drug use?: Yes Name of Substance 1: marijuana 1 - Age of First Use: 14 1 - Amount (size/oz): 1 blunt or 2 1 - Frequency: daily 1 - Duration: 10 years 1 - Last Use / Amount: last night                  Sleep: Fair  Appetite:  Fair  Current Medications: Current Facility-Administered Medications  Medication Dose Route Frequency Provider Last Rate Last Dose  . acetaminophen (TYLENOL) tablet 650 mg  650 mg Oral Q6H PRN Nira ConnBerry, Jason A, NP      . alum & mag hydroxide-simeth (MAALOX/MYLANTA) 200-200-20 MG/5ML suspension 30 mL  30 mL Oral Q4H PRN Nira ConnBerry, Jason A, NP      . citalopram (CELEXA) tablet 20 mg  20 mg Oral Daily Oneta RackLewis, Tanika N, NP   20 mg at 07/10/16 0758  . hydrOXYzine (ATARAX/VISTARIL) tablet 25 mg  25 mg Oral TID PRN Nira ConnBerry, Jason A, NP      . magnesium hydroxide (MILK OF MAGNESIA) suspension 30 mL  30 mL Oral Daily PRN Nira ConnBerry, Jason A, NP      . nicotine polacrilex (NICORETTE) gum 2 mg  2 mg Oral PRN Cobos, Rockey SituFernando A, MD      . traZODone (DESYREL) tablet 50 mg  50 mg Oral QHS PRN Jackelyn PolingBerry, Jason A, NP  Lab Results:  No results found for this or any previous visit (from the past 48 hour(s)).  Blood Alcohol level:  Lab Results  Component Value Date   ETH <5 07/07/2016    Metabolic Disorder Labs: Lab Results  Component Value Date   HGBA1C 5.0 07/08/2016   MPG 97 07/08/2016   No results found for: PROLACTIN Lab Results  Component Value Date   CHOL 180 07/08/2016   TRIG 69 07/08/2016   HDL 48 07/08/2016   CHOLHDL 3.8 07/08/2016   VLDL 14 07/08/2016   LDLCALC 118 (H) 07/08/2016    Physical Findings: AIMS: Facial and Oral Movements Muscles of Facial Expression: None, normal Lips and Perioral Area: None, normal Jaw: None, normal Tongue: None, normal,Extremity Movements Upper (arms, wrists, hands, fingers): None, normal Lower (legs, knees, ankles, toes): None, normal, Trunk  Movements Neck, shoulders, hips: None, normal, Overall Severity Severity of abnormal movements (highest score from questions above): None, normal Incapacitation due to abnormal movements: None, normal [name] awareness of abnormal movements (rate only patient's report): No Awareness, Dental Status Current problems with teeth and/or dentures?: No Does patient usually wear dentures?: No  CIWA:    COWS:     Musculoskeletal: Strength & Muscle Tone: within normal limits Gait & Station: normal Patient leans: N/A  Psychiatric Specialty Exam: Physical Exam  Vitals reviewed. Constitutional: He is oriented to person, place, and time. He appears well-developed.  HENT:  Head: Normocephalic.  Cardiovascular: Normal rate.   Neurological: He is alert and oriented to person, place, and time.  Psychiatric: He has a normal mood and affect. His behavior is normal.    Review of Systems  Psychiatric/Behavioral: Negative for depression (stable). The patient is not nervous/anxious (stable).     Blood pressure 121/83, pulse 83, temperature 98.1 F (36.7 C), resp. rate 18, height 5' 8.5" (1.74 m), weight 63 kg (139 lb).Body mass index is 20.83 kg/m.  General Appearance: Casual and Fairly Groomed  Eye Contact:  Good  Speech:  Clear and Coherent  Volume:  Normal  Mood:  Depressed and Irritable  Affect:  Depressed and Flat  Thought Process:  Coherent  Orientation:  Full (Time, Place, and Person)  Thought Content:  Hallucinations: None and Rumination  Suicidal Thoughts:  No  Homicidal Thoughts:  No  Memory:  Immediate;   Fair Recent;   Fair Remote;   Fair  Judgement:  Fair  Insight:  Fair  Psychomotor Activity:  Normal  Concentration:  Concentration: Good  Recall:  Fair  Fund of Knowledge:  Fair  Language:  Fair  Akathisia:  No  Handed:  Right  AIMS (if indicated):     Assets:  Communication Skills Desire for Improvement Resilience Social Support  ADL's:  Intact  Cognition:  WNL   Sleep:  Number of Hours: 6.75     I agree with current treatment plan on 07/10/2016, Patient seen face-to-face for psychiatric evaluation follow-up, chart reviewed. Reviewed the information documented and agree with the treatment plan.  Treatment Plan Summary: Daily contact with patient to assess and evaluate symptoms and progress in treatment and Medication management   Continue with current treatment plan on 07/10/2016 except where noted.   Increased with Celexa 10 mg to 20mg   for mood stabilization. Continue with Trazodone 50 mg for insomnia  Will continue to monitor vitals ,medication compliance and treatment side effects while patient is here.  Reviewed labs: ,BAL - 0, UDS - pos for Thc  CSW will start working on disposition.  Patient  to participate in therapeutic milieu  Oneta Rack, NP 07/10/2016, 12:05 PM  Notes reviewed, agree with plan

## 2016-07-10 NOTE — Plan of Care (Signed)
Problem: Education: Goal: Verbalization of understanding the information provided will improve Outcome: Completed/Met Date Met: 07/10/16 Patient is verbalizing awareness and understanding of information and education provided. Confirms understanding of POC.  Problem: Medication: Goal: Compliance with prescribed medication regimen will improve Outcome: Progressing Patient is med compliant.

## 2016-07-10 NOTE — Progress Notes (Signed)
Patient signed 72 hour Request For Discharge on 07/10/17 at 0815. Form is filed in front section of paper chart.

## 2016-07-10 NOTE — [facility] Group Notes (Signed)
[facility]:  (Clinical Social Work)   06/19/2016    1:00-2:00PM  Summary of Progress/Problems:   The main focus of today's process group was to   1)  Identify current healthy supports  2)  discuss importance of adding supports to stay well once out of the hospital  3)  generate ideas about what healthy supports can be added   An emphasis was placed on using counselor, doctor, therapy groups, 12-step groups, and problem-specific support groups to expand supports.    The patient stated his current supports include his girlfriend and her mother as well as friend who is an LCSW.  He would like to become a better self-support and utilize his network of supports and not close himself off to people who are able and want to help him.  Type of Therapy:  Process Group with Motivational Interviewing  Participation Level:  Active  Participation Quality:  Attentive and Sharing  Affect:  Blunted  Cognitive:  Appropriate  Insight:  Developing/Improving  Engagement in Therapy:  Engaged  Modes of Intervention:   Education, Support and Processing  Ambrose MantleMareida Grossman-Orr, LCSW 06/19/2016    2:17 PM

## 2016-07-10 NOTE — Progress Notes (Signed)
Patient has been up and active on the unit, attended group this evening and has voiced no complaints. He reports that he had visitors this evening and that helped out a lot. Patient currently denies having pain, -si/hi/a/v hall. Support and encouragement offered, safety maintained on unit, will continue to monitor.

## 2016-07-10 NOTE — [facility] Group Notes (Signed)
Patient attended Nursing Education group this afternoon. TED talk by Brene Brown entitled "The Power of Vulnerability" was shared and subsequent discussion and education provided. Topics included disease education - Bipolar D/O, Anxiety and Substance Abuse among other patient issues and concerns. Patient was engaged and appropriate.  

## 2016-07-11 NOTE — Progress Notes (Signed)
Lance Park had been up and visible in milieu this evening, did attend and participate in evening group activity. Park spoke about ways to cope and spoke briefly about some issues he is having with his girlfriend. Park spoke about being ready for discharge soon, did not require or ask for any bedtime medications and did not verbalize any complaints of pain. A. Support and encouragement provided. R. Safety maintained, will continue to monitor.

## 2016-07-11 NOTE — Tx Team (Signed)
Interdisciplinary Treatment and Diagnostic Plan Update 07/11/2016 Time of Session: 9:30am  Lance Park  MRN: [id number]  Principal Diagnosis: Severe major depression without psychotic features [facility])  Secondary Diagnoses: Principal Problem:   Severe major depression without psychotic features (HCC)   Current Medications:  Current Facility-Administered Medications  Medication Dose Route Frequency Provider Last Rate Last Dose  . acetaminophen (TYLENOL) tablet 650 mg  650 mg Oral Q6H PRN Jackelyn Poling, NP      . alum & mag hydroxide-simeth (MAALOX/MYLANTA) 200-200-20 MG/5ML suspension 30 mL  30 mL Oral Q4H PRN Nira Conn A, NP      . citalopram (CELEXA) tablet 20 mg  20 mg Oral Daily Oneta Rack, NP   20 mg at 07/11/16 1191  . hydrOXYzine (ATARAX/VISTARIL) tablet 25 mg  25 mg Oral TID PRN Jackelyn Poling, NP      . magnesium hydroxide (MILK OF MAGNESIA) suspension 30 mL  30 mL Oral Daily PRN Nira Conn A, NP      . nicotine polacrilex (NICORETTE) gum 2 mg  2 mg Oral PRN Cobos, Rockey Situ, MD      . traZODone (DESYREL) tablet 50 mg  50 mg Oral QHS PRN Jackelyn Poling, NP        PTA Medications: Prescriptions Prior to Admission  Medication Sig Dispense Refill Last Dose  . acetaminophen (TYLENOL) 325 MG tablet Take 650 mg by mouth every 6 (six) hours as needed for moderate pain.   Past Month at Unknown time    Treatment Modalities: Medication Management, Group therapy, Case management,  1 to 1 session with clinician, Psychoeducation, Recreational therapy.  Patient Stressors: Financial difficulties Loss of Job Substance abuse Patient Strengths: Dentist for treatment/growth Physical Health Supportive family/friends  Physician Treatment Plan for Primary Diagnosis: Severe major depression without psychotic features (HCC) Long Term Goal(s): Improvement in symptoms so as ready for discharge Short Term Goals: Ability to identify  changes in lifestyle to reduce recurrence of condition will improve Ability to verbalize feelings will improve Ability to disclose and discuss suicidal ideas Compliance with prescribed medications will improve Ability to maintain clinical measurements within normal limits will improve Compliance with prescribed medications will improve Ability to identify triggers associated with substance abuse/mental health issues will improve  Medication Management: Evaluate patient's response, side effects, and tolerance of medication regimen.  Therapeutic Interventions: 1 to 1 sessions, Unit Group sessions and Medication administration.  Evaluation of Outcomes: Progressing  Physician Treatment Plan for Secondary Diagnosis: Principal Problem:   Severe major depression without psychotic features (HCC)  Long Term Goal(s): Improvement in symptoms so as ready for discharge  Short Term Goals: Ability to identify changes in lifestyle to reduce recurrence of condition will improve Ability to verbalize feelings will improve Ability to disclose and discuss suicidal ideas Compliance with prescribed medications will improve Ability to maintain clinical measurements within normal limits will improve Compliance with prescribed medications will improve Ability to identify triggers associated with substance abuse/mental health issues will improve  Medication Management: Evaluate patient's response, side effects, and tolerance of medication regimen.  Therapeutic Interventions: 1 to 1 sessions, Unit Group sessions and Medication administration.  Evaluation of Outcomes: Progressing  RN Treatment Plan for Primary Diagnosis: Severe major depression without psychotic features (HCC) Long Term Goal(s): Knowledge of disease and therapeutic regimen to maintain health will improve  Short Term Goals: Compliance with prescribed medications will improve  Medication Management: RN will administer medications as ordered  by provider, will assess and evaluate patient's response and provide education to patient for prescribed medication. RN will report any adverse and/or side effects to prescribing provider.  Therapeutic Interventions: 1 on 1 counseling sessions, Psychoeducation, Medication administration, Evaluate responses to treatment, Monitor vital signs and CBGs as ordered, Perform/monitor CIWA, COWS, AIMS and Fall Risk screenings as ordered, Perform wound care treatments as ordered.  Evaluation of Outcomes: Progressing  LCSW Treatment Plan for Primary Diagnosis: Severe major depression without psychotic features (HCC) Long Term Goal(s): Safe transition to appropriate next level of care at discharge, Engage patient in therapeutic group addressing interpersonal concerns. Short Term Goals: Engage patient in aftercare planning with referrals and resources, Increase ability to appropriately verbalize feelings, Increase emotional regulation, Identify triggers associated with mental health/substance abuse issues and Increase skills for wellness and recovery  Therapeutic Interventions: Assess for all discharge needs, 1 to 1 time with Social worker, Explore available resources and support systems, Assess for adequacy in community support network, Educate family and significant other(s) on suicide prevention, Complete Psychosocial Assessment, Interpersonal group therapy.  Evaluation of Outcomes: Progressing  Progress in Treatment: Attending groups: Yes Participating in groups: Yes Taking medication as prescribed: Yes, MD continues to assess for medication changes as needed Toleration medication: Yes, no side effects reported at this time Family/Significant other contact made: Yes, pt's ex-girlfriend contacted Patient understands diagnosis: yes, AEB pt's willingness to participate in treatment  Discussing patient identified problems/goals with staff: Yes Medical problems stabilized or resolved: Yes Denies  suicidal/homicidal ideation: Yes Issues/concerns per patient self-inventory: None Other: N/A  New problem(s) identified: None identified at this time.   New Short Term/Long Term Goal(s): None identified at this time.   Discharge Plan or Barriers: Pt will return home and follow up with an outpatient provider.  Reason for Continuation of Hospitalization:  Anxiety  Depression Medication stabilization  Estimated Length of Stay: 1-2 days; Estimated discharge date 07/12/16  Attendees: Patient: 07/11/2016 3:52 PM  Physician: Dr. Elna BreslowEappen 07/11/2016 3:52 PM  Nursing: Rodell PernaPatrice, RN; [place], RN 07/11/2016 3:52 PM  RN Care Manager:  07/11/2016 3:52 PM  Social Worker: Donnelly StagerLynn Ranjit Ashurst, LCSWA 07/11/2016 3:52 PM  Recreational Therapist:  07/11/2016 3:52 PM  Other: Armandina StammerAgnes Nwoko, NP 07/11/2016 3:52 PM  Other:  07/11/2016 3:52 PM  Other: 07/11/2016 3:52 PM  Scribe for Treatment Team: Jonathon JordanLynn B Evi Mccomb, MSW,LCSWA 07/11/2016 3:52 PM

## 2016-07-11 NOTE — Progress Notes (Signed)
D: Pt presents anxious on approach. Pt denies depression, anxiety and suicidal thoughts today. Pt verbalized feeling stable enough to discharge home today and plans to follow-up with [facility]. Pt verbalized that he's tolerating his Celexa well and denies any side effects. Pt compliant with attending groups and participating. Pt made aware that he will be discharging home tomorrow 7/3. A: Medications reviewed with pt. Medications administered as ordered per MD.  Verbal support provided. Pt encouraged to attend groups. 15 minute checks performed for safety. R: Pt compliant with tx.

## 2016-07-11 NOTE — Progress Notes (Signed)
Adult Psychoeducational Group Note  Date:  07/11/2016 Time:  1:48 PM  Group Topic/Focus:  Wellness Toolbox:   The focus of this group is to discuss various aspects of wellness, balancing those aspects and exploring ways to increase the ability to experience wellness.  Patients will create a wellness toolbox for use upon discharge.  Participation Level:  Active  Participation Quality:  Appropriate and Attentive  Affect:  Angry  Cognitive:  Alert and Appropriate  Insight: Appropriate  Engagement in Group:  Engaged  Modes of Intervention:  Discussion and Education  Additional Comments:  Pt appeared agitated during group because he said he was told over the weekend that he would be discharging today(Monday). But he was informed by the RN that he would not be discharging today. Pt ensured the staff that he would remained calm.  Kaydra Borgen E 07/11/2016, 1:48 PM

## 2016-07-11 NOTE — [facility] Group Notes (Signed)
[facility] LCSW Group Therapy  07/11/2016 1:15pm  Type of Therapy: Group Therapy   Topic: Overcoming Obstacles  Participation Level: Active  Participation Quality: Appropriate   Affect: Appropriate  Cognitive: Appropriate and Oriented  Insight: Developing/Improving and Improving  Engagement in Therapy: Improving  Modes of Intervention: Discussion, Exploration, Problem-solving and Support  Description of Group:  In this group patients will be encouraged to explore what they see as obstacles to their own wellness and recovery. They will be guided to discuss their thoughts, feelings, and behaviors related to these obstacles. The group will process together ways to cope with barriers, with attention given to specific choices patients can make. Each patient will be challenged to identify changes they are motivated to make in order to overcome their obstacles. This group will be process-oriented, with patients participating in exploration of their own experiences as well as giving and receiving support and challenge from other group members.  Summary of Patient Progress:  Pt states that his biggest obstacle will be cutting off contact with his ex-girlfriend. Pt reports that he has been talking with his ex every day that he's been in the hospital so it will be difficult to cut that off cold [place].   Therapeutic Modalities:  Cognitive Behavioral Therapy Solution Focused Therapy Motivational Interviewing Relapse Prevention Therapy  Lance Park, MSW, Theresia MajorsLCSWA [phone]

## 2016-07-11 NOTE — Progress Notes (Signed)
[facility] MD Progress Note  07/11/2016 3:26 PM [place] L [place]  MRN:  [id number]   Subjective: [place] reports, "I'm doing well. I got to go outside, we just returned to the unit. I got some exercise. My mood is improving well".  Objective: [place] is seen, sitting in the dayroom interacting with peers. Patient continue to report he is improving day by day and states that he is planning to follow-up with [facility]. Patient denies suicidal or homicidal ideations during this assessment. Patient reports feeling better after taking Celexa. Reports he has been attending group sessions. Reports he is eating and sleeping well. Patient denies any depressive symptoms. He says he took some pills to get some sleep because a lot happened, from losing his job to living in his car to [employer] with his mother. He just needed to rest his mind. However, says he has learned a good lesson. Has learned to take life easy from here on.  Support, encouragement and reassurance was provided.   Principal Problem: Severe major depression without psychotic features (HCC)  Diagnosis:   Patient Active Problem List   Diagnosis Date Noted  . Severe major depression without psychotic features (HCC) [F32.2] 07/07/2016   Total Time spent with patient: 15 minutes  Past Psychiatric History: Major depression.  Past Medical History:  Past Medical History:  Diagnosis Date  . Asthma    "As a child, I grew out of it"  . Depression    History reviewed. No pertinent surgical history.  Family History: History reviewed. No pertinent family history.  Family Psychiatric  History: See H&P  Social History:  History  Alcohol Use No     History  Drug Use  . Types: Marijuana    Social History   Social History  . Marital status: Single    Spouse name: N/A  . Number of children: N/A  . Years of education: N/A   Social History Main Topics  . Smoking status: Current Every Day Smoker    Packs/day: 0.50    Types: Cigarettes  .  Smokeless tobacco: Never Used  . Alcohol use No  . Drug use: Yes    Types: Marijuana  . Sexual activity: Yes   Other Topics Concern  . None   Social History Narrative  . None   Additional Social History:    Pain Medications: denies Prescriptions: denies Over the Counter: denies History of alcohol / drug use?: Yes Name of Substance 1: marijuana 1 - Age of First Use: 14 1 - Amount (size/oz): 1 blunt or 2 1 - Frequency: daily 1 - Duration: 10 years 1 - Last Use / Amount: last night  Sleep: Fair  Appetite:  Fair  Current Medications: Current Facility-Administered Medications  Medication Dose Route Frequency Provider Last Rate Last Dose  . acetaminophen (TYLENOL) tablet 650 mg  650 mg Oral Q6H PRN Jackelyn Poling, NP      . alum & mag hydroxide-simeth (MAALOX/MYLANTA) 200-200-20 MG/5ML suspension 30 mL  30 mL Oral Q4H PRN Nira Conn A, NP      . citalopram (CELEXA) tablet 20 mg  20 mg Oral Daily Oneta Rack, NP   20 mg at 07/11/16 8295  . hydrOXYzine (ATARAX/VISTARIL) tablet 25 mg  25 mg Oral TID PRN Jackelyn Poling, NP      . magnesium hydroxide (MILK OF MAGNESIA) suspension 30 mL  30 mL Oral Daily PRN Nira Conn A, NP      . nicotine polacrilex (NICORETTE) gum 2 mg  2 mg Oral PRN Cobos, Rockey SituFernando A, MD      . traZODone (DESYREL) tablet 50 mg  50 mg Oral QHS PRN Jackelyn PolingBerry, Jason A, NP       Lab Results:  No results found for this or any previous visit (from the past 48 hour(s)).  Blood Alcohol level:  Lab Results  Component Value Date   ETH <5 07/07/2016   Metabolic Disorder Labs: Lab Results  Component Value Date   HGBA1C 5.0 07/08/2016   MPG 97 07/08/2016   No results found for: PROLACTIN Lab Results  Component Value Date   CHOL 180 07/08/2016   TRIG 69 07/08/2016   HDL 48 07/08/2016   CHOLHDL 3.8 07/08/2016   VLDL 14 07/08/2016   LDLCALC 118 (H) 07/08/2016    Physical Findings: AIMS: Facial and Oral Movements Muscles of Facial Expression: None,  normal Lips and Perioral Area: None, normal Jaw: None, normal Tongue: None, normal,Extremity Movements Upper (arms, wrists, hands, fingers): None, normal Lower (legs, knees, ankles, toes): None, normal, Trunk Movements Neck, shoulders, hips: None, normal, Overall Severity Severity of abnormal movements (highest score from questions above): None, normal Incapacitation due to abnormal movements: None, normal [name] awareness of abnormal movements (rate only patient's report): No Awareness, Dental Status Current problems with teeth and/or dentures?: No Does patient usually wear dentures?: No  CIWA:    COWS:     Musculoskeletal: Strength & Muscle Tone: within normal limits Gait & Station: normal Patient leans: N/A  Psychiatric Specialty Exam: Physical Exam  Vitals reviewed. Constitutional: He is oriented to person, place, and time. He appears well-developed.  HENT:  Head: Normocephalic.  Cardiovascular: Normal rate.   Neurological: He is alert and oriented to person, place, and time.  Psychiatric: He has a normal mood and affect. His behavior is normal.    Review of Systems  Psychiatric/Behavioral: Negative for depression (stable). The patient is not nervous/anxious (stable).     Blood pressure 122/81, pulse 67, temperature 98.2 F (36.8 C), temperature source Oral, resp. rate 16, height 5' 8.5" (1.74 m), weight 63 kg (139 lb).Body mass index is 20.83 kg/m.  General Appearance: Casual and Fairly Groomed  Eye Contact:  Good  Speech:  Clear and Coherent  Volume:  Normal  Mood:  "Improving"  Affect:  Congruent  Thought Process:  Coherent, Goal Directed and Linear  Orientation:  Full (Time, Place, and Person)  Thought Content:  Rumination  Suicidal Thoughts:  No  Homicidal Thoughts:  No  Memory:  Immediate;   Good Recent;   Good Remote;   Good  Judgement:  Good  Insight:  Fair and Present  Psychomotor Activity:  Normal  Concentration:  Concentration: Good  Recall:   Fair  Fund of Knowledge:  Fair  Language:  Fair  Akathisia:  No  Handed:  Right  AIMS (if indicated):     Assets:  Communication Skills Desire for Improvement Resilience Social Support  ADL's:  Intact  Cognition:  WNL  Sleep:  Number of Hours: 5.5   Treatment Plan Summary: Daily contact with patient to assess and evaluate symptoms and progress in treatment and Medication management   Continue with current treatment plan on 07/11/16 except where noted.   Continue Celexa 20mg   for depression.  Continue Trazodone 50 mg for insomnia  Will continue to monitor vitals ,medication compliance and treatment side effects while patient is here.  Reviewed labs: ,BAL - 0, UDS - pos for Thc  CSW will start working on disposition.  Patient to participate in therapeutic milieu  Sanjuana Kava, NP, PMHNP, FNP-BC 07/11/2016, 3:26 PM  Patient ID: [place] L Matthes, male   DOB: 12/23/1990, 26 y.o.   MRN: [id number]

## 2016-07-12 MED ORDER — HYDROXYZINE HCL 25 MG PO TABS: 25.0000 mg | ORAL_TABLET | Freq: Three times a day (TID) | ORAL | 0 refills | Status: DC | PRN

## 2016-07-12 MED ORDER — TRAZODONE HCL 50 MG PO TABS: 50.0000 mg | ORAL_TABLET | Freq: Every evening | ORAL | 0 refills | Status: DC | PRN

## 2016-07-12 MED ORDER — NICOTINE POLACRILEX 2 MG MT GUM: 2.0000 mg | CHEWING_GUM | OROMUCOSAL | 0 refills | Status: DC | PRN

## 2016-07-12 MED ORDER — CITALOPRAM HYDROBROMIDE 20 MG PO TABS: 20.0000 mg | ORAL_TABLET | Freq: Every day | ORAL | 0 refills | Status: DC

## 2016-07-12 NOTE — Progress Notes (Signed)
Pt d/c from the hospital. All items returned. D/C instructions given, prescriptions given and samples given. Pt denies si and hi. 

## 2016-07-12 NOTE — [facility] Suicide Risk Assessment (Signed)
[facility] Discharge Suicide Risk Assessment   Principal Problem: Severe major depression without psychotic features [facility]) Discharge Diagnoses:  Patient Active Problem List   Diagnosis Date Noted  . Severe major depression without psychotic features [facility]) [F32.2] 07/07/2016   Patient is a 26 year old male transferred from [place] long ED for worsening of suicidal ideation along with multiple recent stressors which include loss of his job, breakup with his girlfriend. Patient felt that he was a burden on everyone, wanting to end his life.  Patient this morning reports that he has had a lot of time to think, feels that ending his life is not the answer and plans to live with his mother for some time. He adds that he wants to find a job, get his life back on track. He denies any suicidal ideation, homicidal ideation, delusions or paranoia. He states that he's eating fine and sleeping well and feels that the hospitalization has been helpful, giving him time to think and plan his future. On a scale of 0-10, with 0 being no symptoms in 10 being the worst, patient reports his depression 2 out of 10. He adds that his ex-girlfriend is still supportive of him but feels that their relationship is not going to work out. He has that he is okay with this Total Time spent with patient: 30 minutes  Musculoskeletal: Strength & Muscle Tone: within normal limits Gait & Station: normal Patient leans: N/A  Psychiatric Specialty Exam: Review of Systems  Constitutional: Negative.  Negative for fever and malaise/fatigue.  HENT: Negative.  Negative for congestion and sore throat.   Eyes: Negative.  Negative for blurred vision and double vision.  Respiratory: Negative.  Negative for cough and shortness of breath.   Cardiovascular: Negative.  Negative for chest pain and palpitations.  Gastrointestinal: Negative.  Negative for abdominal pain, heartburn, nausea and vomiting.  Genitourinary: Negative.  Negative for dysuria.   Musculoskeletal: Negative.  Negative for falls and myalgias.  Neurological: Negative.  Negative for dizziness, seizures, loss of consciousness and weakness.  Endo/Heme/Allergies: Negative.  Negative for environmental allergies.  Psychiatric/Behavioral: Negative.  Negative for depression, hallucinations and suicidal ideas. The patient is not nervous/anxious and does not have insomnia.     Blood pressure 122/84, pulse 68, temperature 98.3 F (36.8 C), temperature source Oral, resp. rate 16, height 5' 8.5" (1.74 m), weight 63 kg (139 lb).Body mass index is 20.83 kg/m.  General Appearance: Casual  Eye Contact::  Good  Speech:  Clear and Coherent and Normal Rate409  Volume:  Normal  Mood:  Euthymic  Affect:  Congruent and Full Range  Thought Process:  Coherent, Goal Directed and Descriptions of Associations: Intact  Orientation:  Full (Time, Place, and Person)  Thought Content:  WDL  Suicidal Thoughts:  No  Homicidal Thoughts:  No  Memory:  Immediate;   Fair Recent;   Fair Remote;   Fair  Judgement:  Intact  Insight:  Present  Psychomotor Activity:  Normal  Concentration:  Fair  Recall:  [employer] of Knowledge:Fair  Language: Fair  Akathisia:  No  Handed:  Right  AIMS (if indicated):     Assets:  Communication Skills Desire for Improvement Housing Social Support Transportation  Sleep:  Number of Hours: 6.5  Cognition: WNL  ADL's:  Intact   Mental Status Per Nursing Assessment::   On Admission:  NA  Demographic Factors:  Adolescent or young adult  Loss Factors: NA  Historical Factors: Prior suicide attempts, Family history of mental illness or  substance abuse and Impulsivity  Risk Reduction Factors:   Positive social support and Positive therapeutic relationship  Continued Clinical Symptoms:  Alcohol/Substance Abuse/Dependencies Previous Psychiatric Diagnoses and Treatments  Cognitive Features That Contribute To Risk:  None    Suicide Risk:  Minimal: No  identifiable suicidal ideation.  Patients presenting with no risk factors but with morbid ruminations; may be classified as minimal risk based on the severity of the depressive symptoms  Follow-up Information    [facility] Follow up.   Specialty:  Behavioral Health Why:  Please go within 1-3 days of discharge in order to established for medication management services. Walk-in hours are from 8am-3pm Monday-Friday. Contact information: [address] [place] [address] [phone]        [facility] Follow up on 07/14/2016.   Specialty:  Behavioral Health Why:  Therapy appointment at 9:30am with Rolan Lipaave Trayford. Contact information: [address] [place] [address] [phone]           Plan Of Care/Follow-up recommendations:  Activity:  As tolerated Diet:  Regular Other:  Keep follow-up appointments and take medications as prescribed  Nelly RoutKUMAR,Annabeth Tortora, MD 07/12/2016, 1:29 PM

## 2016-07-12 NOTE — Progress Notes (Signed)
Lance Park had been up and visible in milieu this evening, did attend and participate in evening group activity. He spoke about how he was told over the weekend that he would be discharged today and was then told today that he was unable to go. He spoke about how he was upset but was able to walk away from the situation and compose himself and not let what he was told turn into a negative. He also spoke about how he is slated for discharge in the morning and spoke about how he feels ready to go. He did not request or receive any bedtime medications and did not verbalize any complaints of pain. A. Support and encouragement provided. R. Safety maintained, will continue to monitor.

## 2016-07-12 NOTE — Progress Notes (Signed)
  [facility] Discharge Plan :  Will you be returning to the same living situation after discharge:  Yes,  pt will be returning home with family. At discharge, do you have transportation home?: Yes,  pt's friend will pick up. Do you have the ability to pay for your medications: Yes,  prescriptions and samples provided.  Release of information consent forms completed and in the chart;  [name] signature needed at discharge.  Patient to Follow up at: Follow-up Information    [facility] Follow up.   Specialty:  Behavioral Health Why:  Please go within 1-3 days of discharge in order to established for medication management services. Walk-in hours are from 8am-3pm Monday-Friday. Contact information: [address] [place] [address] [phone]        [facility] Follow up on 07/14/2016.   Specialty:  Behavioral Health Why:  Therapy appointment at 9:30am with Rolan Lipaave Trayford. Contact information: [address] [place] [address] [phone]           Next level of care provider [facility] Link:no  Safety Planning and Suicide Prevention discussed: Yes,  with pt and with pt's ex-girlfriend.  Have you used any form of tobacco in the last 30 days? (Cigarettes, Smokeless Tobacco, Cigars, and/or Pipes): Yes  Has patient been referred to the Quitline?: Patient refused referral  Patient has been referred for addiction treatment: Yes  Jonathon JordanLynn B Yosiah Jasmin, MSW, LCSWA 07/12/2016, 11:02 AM

## 2016-07-12 NOTE — Progress Notes (Signed)
Adult Psychoeducational Group Note  Date:  07/12/2016 Time:  5:11 AM  Group Topic/Focus:  Wrap-Up Group:   The focus of this group is to help patients review their daily goal of treatment and discuss progress on daily workbooks.  Participation Level:  Active  Participation Quality:  Appropriate, Attentive, Sharing and Supportive  Affect:  Appropriate  Cognitive:  Appropriate  Insight: Appropriate and Good  Engagement in Group:  Developing/Improving  Modes of Intervention:  Discussion, Education, Socialization and Support  Additional Comments:  Pt shared during group that he was upset earlier but he got over it because he was suppose to discharge but did not. Pt stated he is grateful to be here in the hospital because today he learned and gained a lot of information about what to do with his situation when he leaves here. Pt did not express more about what his situation was.  Karleen HampshireFox, Taje Tondreau Brittini 07/12/2016, 5:11 AM

## 2016-07-12 NOTE — Progress Notes (Signed)
[facility] Group Notes:  (Nursing/MHT/Case Management/Adjunct)  Date:  07/12/2016  Time:  10:02 AM  Type of Therapy:  Nurse Education  Participation Level:  Active  Participation Quality:  Appropriate, Sharing and Supportive  Affect:  appropriate  Cognitive:  Alert and Appropriate  Insight:  Improving  Engagement in Group:  Developing/Improving and Supportive  Modes of Intervention:  Activity, Discussion, Education, Socialization and Support  Summary of Progress/Problems:The purpose of this group is to support patients  to develop a goal for today and to introduce them to the benefits and uses of aromatherapy. Pt was appropriate interacting in group. Pt's goal is to continue to use positive coping skills.  Beatrix ShipperWright, Sarahanne Novakowski Martin 07/12/2016, 10:02 AM
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
## 2016-10-22 ENCOUNTER — Encounter (HOSPITAL_COMMUNITY): Payer: Self-pay | Admitting: *Deleted

## 2016-10-22 ENCOUNTER — Inpatient Hospital Stay (HOSPITAL_COMMUNITY)
Admission: RE | Admit: 2016-10-22 | Discharge: 2016-10-26 | DRG: 885 | Disposition: A | Discharge status: Home / Self Care | Payer: Federal, State, Local not specified - Other | Attending: Psychiatry | Admitting: Psychiatry

## 2016-10-22 DIAGNOSIS — F17210 Nicotine dependence, cigarettes, uncomplicated: Secondary | ICD-10-CM | POA: Diagnosis not present

## 2016-10-22 DIAGNOSIS — R45851 Suicidal ideations: Secondary | ICD-10-CM | POA: Diagnosis present

## 2016-10-22 DIAGNOSIS — Z6379 Other stressful life events affecting family and household: Secondary | ICD-10-CM | POA: Diagnosis not present

## 2016-10-22 DIAGNOSIS — F1210 Cannabis abuse, uncomplicated: Secondary | ICD-10-CM | POA: Diagnosis not present

## 2016-10-22 DIAGNOSIS — Z630 Problems in relationship with spouse or partner: Secondary | ICD-10-CM | POA: Diagnosis not present

## 2016-10-22 DIAGNOSIS — F332 Major depressive disorder, recurrent severe without psychotic features: Principal | ICD-10-CM | POA: Diagnosis present

## 2016-10-22 DIAGNOSIS — F1290 Cannabis use, unspecified, uncomplicated: Secondary | ICD-10-CM | POA: Diagnosis present

## 2016-10-22 DIAGNOSIS — Z560 Unemployment, unspecified: Secondary | ICD-10-CM | POA: Diagnosis not present

## 2016-10-22 DIAGNOSIS — F329 Major depressive disorder, single episode, unspecified: Secondary | ICD-10-CM | POA: Diagnosis present

## 2016-10-22 MED ORDER — TRAZODONE HCL 50 MG PO TABS
50.0000 mg | ORAL_TABLET | Freq: Every evening | ORAL | Status: DC | PRN
  Administered 2016-10-22: 50 mg via ORAL
  Filled 2016-10-22 (×2): qty 1
  Filled 2016-10-22: qty 7

## 2016-10-22 MED ORDER — PNEUMOCOCCAL VAC POLYVALENT 25 MCG/0.5ML IJ INJ: 0.5000 mL | INJECTION | INTRAMUSCULAR | Status: DC

## 2016-10-22 MED ORDER — ALUM & MAG HYDROXIDE-SIMETH 200-200-20 MG/5ML PO SUSP: 30.0000 mL | ORAL | Status: DC | PRN

## 2016-10-22 MED ORDER — HYDROXYZINE HCL 25 MG PO TABS
25.0000 mg | ORAL_TABLET | Freq: Three times a day (TID) | ORAL | Status: DC | PRN
  Filled 2016-10-22: qty 10
  Filled 2016-10-22: qty 1

## 2016-10-22 MED ORDER — MAGNESIUM HYDROXIDE 400 MG/5ML PO SUSP: 30.0000 mL | Freq: Every day | ORAL | Status: DC | PRN

## 2016-10-22 MED ORDER — INFLUENZA VAC SPLIT QUAD 0.5 ML IM SUSY
0.5000 mL | PREFILLED_SYRINGE | INTRAMUSCULAR | Status: DC
  Filled 2016-10-22: qty 0.5

## 2016-10-22 MED ORDER — ACETAMINOPHEN 325 MG PO TABS: 650.0000 mg | ORAL_TABLET | Freq: Four times a day (QID) | ORAL | Status: DC | PRN

## 2016-10-22 NOTE — BH Assessment (Signed)
Tele Assessment Note   Patient Name: Lance Park MRN: [id number] Referring Physician: Leighton Ruff NP Location of Patient:  Location of Provider: Behavioral Health TTS Department  Lance L Bates is an 26 y.o. male presented to [facility] as a walk in accompanied by his father with suicidal ideation with no plan. Patient denies HI/AVH. Patient reports past diagnosis of MDD and Anxiety. Patient has previous inpatient admission for suicidal ideation in June of 2018. Patient report feelings of suicidal ideation approx. 2 weeks ago. Patient report self-mutilation behaviors within the last week with lacerations to left wrist.  Patient reports he feels alive when he cuts himself and that he feels dead inside. Writer asked patient about what his triggers are, and he said he didn't know. [name] dad reports that patient recently broke up with his girlfriend July 2018 and that patient's grandmother (whom patient was close to) died from a heart attack 06/05/2011. Patient reports that he doesn't know how to be alone.  Patient report feelings of depression since middle school.  Patient report four previous attempts of suicide most recent June 2018, overdose accept. Patient is not currently taking prescribed medication "Celexa." Patient report not following up with [facility] as recommended in June 2018 when discharged from inpatient Behavioral Health. Patient reports that he thought he could do it on his own and that he was receiving therapy from [facility]) but stop going because he couldn't pay. [name] drug of choice is marijuana. Patient reports smoking weed all day every day. Patient reports he has been smoking weed since 26 years of age. Patient reports that when he smokes weed, he can "get away" and that he can suppress his feelings and not feel any emotions. Patient currently lives with his dad. Patient reports that he does not sleep well and that at night he sits in his car all night long. He reports "  I just want to get away from people." He states that he feels like a burden and that he stays to his self.  Patient reports experiencing feelings of abandonment by his dad. Patient report his dad was not in his life until he was older. Patient also reports not having a close relationship with his mom because she and his sister were close. Patient denies criminal charges. Patient reports that he cannot contract for safety and he wants to be admitted, inpatient.  Diagnosis: Major Depressive Disorder Severe Recurring  Past Medical History:  Past Medical History:  Diagnosis Date  . Asthma    "As a child, I grew out of it"  . Depression     History reviewed. No pertinent surgical history.  Family History: History reviewed. No pertinent family history.  Social History:  reports that he has been smoking Cigarettes.  He has been smoking about 0.50 packs per day. He has never used smokeless tobacco. He reports that he uses drugs, including Marijuana. He reports that he does not drink alcohol.  Additional Social History:  Alcohol / Drug Use Pain Medications: see mar Prescriptions: see mar Over the Counter: see mar History of alcohol / drug use?: Yes Negative Consequences of Use: Personal relationships Substance #1 Name of Substance 1: marijuana 1 - Age of First Use: 14 1 - Amount (size/oz): unknown 1 - Frequency: daily 1 - Duration: ongoing 1 - Last Use / Amount: 10/21/16  CIWA: CIWA-Ar BP: 114/83 Pulse Rate: (!) 110 COWS:    PATIENT STRENGTHS: (choose at least two) Ability for insight Communication skills  Allergies: No  Known Allergies  Home Medications:  Medications Prior to Admission  Medication Sig Dispense Refill  . citalopram (CELEXA) 20 MG tablet Take 1 tablet (20 mg total) by mouth daily. 30 tablet 0  . hydrOXYzine (ATARAX/VISTARIL) 25 MG tablet Take 1 tablet (25 mg total) by mouth 3 (three) times daily as needed for anxiety. 90 tablet 0  . nicotine polacrilex  (NICORETTE) 2 MG gum Take 1 each (2 mg total) by mouth as needed for smoking cessation. 100 tablet 0  . traZODone (DESYREL) 50 MG tablet Take 1 tablet (50 mg total) by mouth at bedtime as needed for sleep. 30 tablet 0    OB/GYN Status:  No LMP for male patient.  General Assessment Data Location of Assessment: [facility] Assessment Services TTS Assessment: In system Is this a Tele or Face-to-Face Assessment?: Face-to-Face Is this an Initial Assessment or a Re-assessment for this encounter?: Initial Assessment Marital status: Single Is patient pregnant?: No Pregnancy Status: No Living Arrangements: Parent (lives with dad) Can pt return to current living arrangement?: Yes Admission Status: Voluntary Is patient capable of signing voluntary admission?: Yes Referral Source: Self/Family/Friend Insurance type: Astronomer Exam [facility] Walk-in ONLY) Medical Exam completed: Yes  Crisis Care Plan Living Arrangements: Parent (lives with dad) Name of Psychiatrist: none reported  Name of Therapist: pt reported previous therapist Army Fossa (pt. report last session 3-4 months ago)     Risk to self with the past 6 months Suicidal Ideation: Yes-Currently Present Has patient been a risk to self within the past 6 months prior to admission? : No Suicidal Intent: No-Not Currently/Within Last 6 Months (previous attempt June 2018) Has patient had any suicidal intent within the past 6 months prior to admission? : Yes (previous attempt June 2018) Is patient at risk for suicide?: Yes (suicidal thoughts; negative intrustive thoughts) Suicidal Plan?: No Has patient had any suicidal plan within the past 6 months prior to admission? : Yes (previuos overdose attempt June 2018) Access to Means: No What has been your use of drugs/alcohol within the last 12 months?: marijuana  Previous Attempts/Gestures: Yes How many times?: 4 Other Self Harm Risks: cutting  Triggers for Past Attempts: Other  (Comment) (life stress; death in family) Intentional Self Injurious Behavior: Cutting Comment - Self Injurious Behavior: cut left wrist Family Suicide History: No Recent stressful life event(s): Trauma (Comment) (dad suffered heart attack two weeks ago) Persecutory voices/beliefs?: No Depression: Yes Depression Symptoms: Isolating, Guilt, Loss of interest in usual pleasures, Feeling worthless/self pity, Feeling angry/irritable Substance abuse history and/or treatment for substance abuse?: No  Risk to Others within the past 6 months Homicidal Ideation: No Does patient have any lifetime risk of violence toward others beyond the six months prior to admission? : No Thoughts of Harm to Others: No Current Homicidal Intent: No Current Homicidal Plan: No Access to Homicidal Means: No Identified Victim: n/a History of harm to others?: No Assessment of Violence: None Noted Violent Behavior Description: none reported  Does patient have access to weapons?: No Criminal Charges Pending?: No Does patient have a court date: No Is patient on probation?: No  Psychosis Hallucinations: None noted Delusions: None noted  Mental Status Report Appearance/Hygiene: Other (Comment) (dress appropriate to weather) Eye Contact: Good Motor Activity: Freedom of movement Speech: Logical/coherent Level of Consciousness: Alert Mood: Depressed, Anxious, Guilty, Helpless, Sad Affect: Depressed Anxiety Level: Moderate Thought Processes: Coherent Judgement: Unimpaired Orientation: Person, Place, Time, Situation Obsessive Compulsive Thoughts/Behaviors: Moderate  Cognitive Functioning Concentration: Normal IQ: Average Insight:  Poor (suicidal thinks, self harming behaviors) Impulse Control: Poor (intrusive negative thinking, suicidal thoughts) Appetite: Poor Sleep: Decreased Total Hours of Sleep: 3 Vegetative Symptoms: None  ADLScreening [facility] Assessment Services) [name] cognitive ability adequate to  safely complete daily activities?: Yes Patient able to express need for assistance with ADLs?: Yes Independently performs ADLs?: Yes (appropriate for developmental age)  Prior Inpatient Therapy Prior Inpatient Therapy: Yes Prior Therapy Dates: June 2018 Prior Therapy Facilty/Provider(s): [facility] Reason for Treatment: Sucidal Intent  Prior Outpatient Therapy Prior Outpatient Therapy: Yes Prior Therapy Dates: last seen 3-4 months ago Prior Therapy Facilty/Provider(s): [facility] Reason for Treatment: MDD and Anxiety Does patient have an ACCT team?: No Does patient have Intensive In-House Services?  : No Does patient have [facility] services? : No Does patient have P4CC services?: No  ADL Screening (condition at time of admission) [name] cognitive ability adequate to safely complete daily activities?: Yes Is the patient deaf or have difficulty hearing?: No Does the patient have difficulty seeing, even when wearing glasses/contacts?: No Does the patient have difficulty concentrating, remembering, or making decisions?: No Patient able to express need for assistance with ADLs?: Yes Does the patient have difficulty dressing or bathing?: No Independently performs ADLs?: Yes (appropriate for developmental age) Does the patient have difficulty walking or climbing stairs?: No Weakness of Legs: None Weakness of Arms/Hands: None  Home Assistive Devices/Equipment Home Assistive Devices/Equipment: None  Therapy Consults (therapy consults require a physician order) PT Evaluation Needed: No OT Evalulation Needed: No SLP Evaluation Needed: No Abuse/Neglect Assessment (Assessment to be complete while patient is alone) Physical Abuse: Denies Verbal Abuse: Denies Sexual Abuse: Denies Exploitation of patient/patient's resources: Denies Self-Neglect: Denies Values / Beliefs Cultural Requests During Hospitalization: None Spiritual Requests During Hospitalization: None Consults Spiritual Care  Consult Needed: No Social Work Consult Needed: No Merchant navy officer (For Healthcare) Does Patient Have a Medical Advance Directive?: No Would patient like information on creating a medical advance directive?: No - Patient declined Nutrition Screen- [facility] Adult/WL/AP [name] home diet: Regular Has the patient recently lost weight without trying?: No Has the patient been eating poorly because of a decreased appetite?: No Malnutrition Screening Tool Score: 0  Additional Information 1:1 In Past 12 Months?: No CIRT Risk: No Elopement Risk: No Does patient have medical clearance?: Yes     Disposition: Per Leighton Ruff NP Patient meets inpatient criteria. Accepted to [facility] Disposition Initial Assessment Completed for this Encounter: Yes Disposition of Patient: Inpatient treatment program Type of inpatient treatment program: Adult   Rockelle Heuerman 10/22/2016 3:00 PM

## 2016-10-22 NOTE — Progress Notes (Signed)
Patient ID: [place] L Tri, male   DOB: 11/12/1990, 26 y.o.   MRN: [id number]    26 year old black male admitted after he presented to [facility] as a walk-in. Pts dad was present at time of walk-in, and was very supportative of patient. When patient initially presented to [facility] he wrote that he was requesting discharge from [facility]. Pt reported that he had been smoking THC daily, when asked how much he reported as much as he could get his hands on. Pt reported that he was depressed and that since last admission to [facility] things have gone down hill. Pt reported that he was suppose to follow up at [facility]), he reported that he did not do that. Pt also reported that he was suppose to be on Celexa, but did not take once he was discharged from [facility]. Pt reported that he was now serious about treatment and that he needed help. Pt reported that his depression was a 5, his hopelessness was a 5, and his anxiety was a 5. Pt reported that prior to admission he was having racing thoughts, but reports that he feels better now. No other issues or concerns noted.

## 2016-10-22 NOTE — Progress Notes (Signed)
[facility] Group Notes:  (Nursing/MHT/Case Management/Adjunct)  Date:  10/22/2016  Time:  2045  Type of Therapy:  wrap up group  Participation Level:  Active  Participation Quality:  Appropriate, Attentive, Sharing and Supportive  Affect:  Appropriate  Cognitive:  Appropriate  Insight:  Improving  Engagement in Group:  Engaged  Modes of Intervention:  Clarification, Education and Support  Summary of Progress/Problems:Pt shared that if he could change any one thing it would be how he reacts to his depression. Pt wishes he would seek out help sooner rather than let it affect his life to the full extent. Pt gives example, " I quit my job, start lying, start stealing, move out of my parents home to live alone in my car. " Pt shares that he wants to be here this time compared to last time not taking treatment seriously. Pt  is grateful for friends and family who have not given on him through his depression. Pt wants to program on the 400 hall. Pt admits to smoking marijuana on a regular basis but feels treatment focused on depression would better help him.   Marcille Buffy 10/22/2016, 11:42 PM

## 2016-10-22 NOTE — Tx Team (Signed)
Initial Treatment Plan 10/22/2016 2:22 PM Lance Park QMV:[id number]    PATIENT STRESSORS: Medication change or noncompliance   PATIENT STRENGTHS: Ability for insight Average or above average intelligence General fund of knowledge   PATIENT IDENTIFIED PROBLEMS: Depression    THC use daily    "I didn't follow up after last admission and did not take depression medication"             DISCHARGE CRITERIA:  Improved stabilization in mood, thinking, and/or behavior Need for constant or close observation no longer present  PRELIMINARY DISCHARGE PLAN: Return to previous living arrangement  PATIENT/FAMILY INVOLVEMENT: This treatment plan has been presented to and reviewed with the patient, Lance Park, and/or family member.  The patient and family have been given the opportunity to ask questions and make suggestions.  [name], RN 10/22/2016, 2:22 PM

## 2016-10-22 NOTE — H&P (Signed)
Behavioral Health Medical Screening Exam  Lance Park is an 26 y.o. male with a history of MDD who arrived voluntarily to [facility] with c/o overwhelming depression and suicide ideations. Patient was recently discharged from [facility] but reported that he did not follow up with OP services.  Total Time spent with patient: 30 minutes  Psychiatric Specialty Exam: Physical Exam  Vitals reviewed. Constitutional: He is oriented to person, place, and time. He appears well-developed and well-nourished.  HENT:  Head: Normocephalic and atraumatic.  Eyes: Pupils are equal, round, and reactive to light.  Neck: Normal range of motion.  Cardiovascular: Normal rate, regular rhythm and normal heart sounds.   Respiratory: Effort normal and breath sounds normal.  GI: Soft. Bowel sounds are normal.  Musculoskeletal: Normal range of motion.  Neurological: He is alert and oriented to person, place, and time.  Skin: Skin is warm and dry.    Review of Systems  Psychiatric/Behavioral: Positive for depression, memory loss, substance abuse and suicidal ideas. Negative for hallucinations. The patient is nervous/anxious. The patient does not have insomnia.   All other systems reviewed and are negative.   Blood pressure 118/74, pulse 95, temperature 98.6 F (37 C), temperature source Oral, resp. rate 18, SpO2 100 %.There is no height or weight on file to calculate BMI.  General Appearance: Casual  Eye Contact:  Good  Speech:  Clear and Coherent and Normal Rate  Volume:  Normal  Mood:  Anxious and Depressed  Affect:  Depressed  Thought Process:  Coherent and Goal Directed  Orientation:  Full (Time, Place, and Person)  Thought Content:  WDL and Logical  Suicidal Thoughts:  Yes.  with intent/plan  Homicidal Thoughts:  No  Memory:  Immediate;   Good Recent;   Good Remote;   Fair  Judgement:  Intact  Insight:  Present  Psychomotor Activity:  Normal  Concentration: Concentration: Good and Attention Span: Good   Recall:  Good  Fund of Knowledge:Good  Language: Good  Akathisia:  Negative  Handed:  Right  AIMS (if indicated):     Assets:  Communication Skills Desire for Improvement Financial Resources/Insurance Leisure Time Physical Health Resilience  Sleep:       Musculoskeletal: Strength & Muscle Tone: within normal limits Gait & Station: normal Patient leans: N/A  Blood pressure 118/74, pulse 95, temperature 98.6 F (37 C), temperature source Oral, resp. rate 18, SpO2 100 %.  Recommendations:   Based on my evaluation the patient appears to have an emergency condition for which I recommend the patient be admitted inpatient for medication management and stabilization.   Delila Pereyra, NP 10/22/2016, 1:00 PM

## 2016-10-23 DIAGNOSIS — Z6379 Other stressful life events affecting family and household: Secondary | ICD-10-CM

## 2016-10-23 DIAGNOSIS — Z630 Problems in relationship with spouse or partner: Secondary | ICD-10-CM

## 2016-10-23 DIAGNOSIS — F332 Major depressive disorder, recurrent severe without psychotic features: Principal | ICD-10-CM

## 2016-10-23 DIAGNOSIS — Z560 Unemployment, unspecified: Secondary | ICD-10-CM

## 2016-10-23 DIAGNOSIS — F17210 Nicotine dependence, cigarettes, uncomplicated: Secondary | ICD-10-CM

## 2016-10-23 LAB — CBC WITH DIFFERENTIAL/PLATELET
Basophils Absolute: 0 10*3/uL (ref 0.0–0.1)
Basophils Relative: 1 %
Eosinophils Absolute: 1.3 10*3/uL — ABNORMAL HIGH (ref 0.0–0.7)
Eosinophils Relative: 16 %
HCT: 46.7 % (ref 39.0–52.0)
Hemoglobin: 15.8 g/dL (ref 13.0–17.0)
Lymphocytes Relative: 29 %
Lymphs Abs: 2.5 10*3/uL (ref 0.7–4.0)
MCH: 26.3 pg (ref 26.0–34.0)
MCHC: 33.8 g/dL (ref 30.0–36.0)
MCV: 77.7 fL — ABNORMAL LOW (ref 78.0–100.0)
Monocytes Absolute: 0.5 10*3/uL (ref 0.1–1.0)
Monocytes Relative: 6 %
Neutro Abs: 4.3 10*3/uL (ref 1.7–7.7)
Neutrophils Relative %: 48 %
Platelets: 248 10*3/uL (ref 150–400)
RBC: 6.01 MIL/uL — ABNORMAL HIGH (ref 4.22–5.81)
RDW: 13.6 % (ref 11.5–15.5)
WBC: 8.7 10*3/uL (ref 4.0–10.5)

## 2016-10-23 LAB — COMPREHENSIVE METABOLIC PANEL
ALT: 22 U/L (ref 17–63)
AST: 18 U/L (ref 15–41)
Albumin: 4.2 g/dL (ref 3.5–5.0)
Alkaline Phosphatase: 75 U/L (ref 38–126)
Anion gap: 7 (ref 5–15)
BUN: 17 mg/dL (ref 6–20)
CO2: 28 mmol/L (ref 22–32)
Calcium: 9.1 mg/dL (ref 8.9–10.3)
Chloride: 102 mmol/L (ref 101–111)
Creatinine, Ser: 1.24 mg/dL (ref 0.61–1.24)
GFR calc Af Amer: >60 mL/min (ref >60)
GFR calc non Af Amer: >60 mL/min (ref >60)
Glucose, Bld: 108 mg/dL — ABNORMAL HIGH (ref 65–99)
Potassium: 3.9 mmol/L (ref 3.5–5.1)
Sodium: 137 mmol/L (ref 135–145)
Total Bilirubin: 0.8 mg/dL (ref 0.3–1.2)
Total Protein: 7.1 g/dL (ref 6.5–8.1)

## 2016-10-23 LAB — RAPID URINE DRUG SCREEN, HOSP PERFORMED
Amphetamines: NOT DETECTED
Barbiturates: NOT DETECTED
Benzodiazepines: NOT DETECTED
Cocaine: NOT DETECTED
Opiates: NOT DETECTED
Tetrahydrocannabinol: POSITIVE — AB

## 2016-10-23 MED ORDER — SERTRALINE HCL 25 MG PO TABS
25.0000 mg | ORAL_TABLET | Freq: Every day | ORAL | Status: DC
  Administered 2016-10-23 – 2016-10-24 (×2): 25 mg via ORAL
  Filled 2016-10-23 (×5): qty 1

## 2016-10-23 NOTE — Progress Notes (Signed)
Data. Patient denies SI/HI/AVH. Verbally contracts for safety on the unit and to come to staff before acting of any self harm thoughts/feelings.  Patient interacting well with staff and other patients. Affect has been bright throughout this shift. He reports, "I didn't take my meds, or follow up with the clinic when I left. I did start smoking pot that night though. I think I am ready to stop playing and make a difference with my life." Lance Park did spend time with the nursing student and decided he wants to go the college and get his nursing degree. He  Reports he has had some negative thoughts about this goal and his ability to follow through, "But I just remember to say, 'I can do this', and I am going to!" On his self assessment patient reports 5/10 for anxiety, depression and hopelessness. His goal for today is: ""earn coping skills." Action. Emotional support and encouragement offered. Education provided on medication, indications and side effect. Q 15 minute checks done for safety. Response. Safety on the unit maintained through 15 minute checks.  Medications taken as prescribed. Attended groups. Remained calm and appropriate through out shift.

## 2016-10-23 NOTE — [facility] Group Notes (Signed)
[facility] LCSW Group Therapy Note  Date/Time:    10/23/2016   1:15-2:15PM  Type of Therapy and Topic:  Group Therapy:  Healthy Self Image and Positive Change  Participation Level:  Active   Description of Group:  In this group, patients compared and contrasted their current "I am...." statements to the visions they identified as desirable for their lives.  Patients discussed their tendency toward cognitive distortions, and how they can go about making positive changes in their cognitions that will positively impact their behaviors.  Many expressions of similarities and mutual support were provided among group members.  Facilitator played a motivational 3-minute speech and a discussion was held regarding reactions.  Patients were left with the task of thinking about what "I am...." statements they can start using in their lives immediately.  Therapeutic Goals: 1. Patient will state their current self-perception as expressed in an "I Am" statement 2. Patient will contrast this with their desired vision for their lives 3. Patient will discuss cognitive distortions and how these affect their ongoing "I Am" thoughts 4. Patient will verbalize statements that challenge their cognitive distortions  Summary of Patient Progress:  The patient expressed initially "I am hopeless and meaningless", then by the end of group was able to state "I am improving" and explain why he was able to make this change.   Therapeutic Modalities Cognitive Behavioral Therapy Motivational Interviewing  Ambrose Mantle, LCSW 10/23/2016 8:18 AM

## 2016-10-23 NOTE — Progress Notes (Signed)
D.  Pt pleasant on approach, denies complaints at this time.  Pt is programming on [address] hall and attended group there.  Pt observed playing cards and appropriately interacting with peers on the unit.  Pt denies need for sleep medication tonight, feels that he will do fine without it.  Pt denies SI/HI/AVH at this time.  A.  Support and encouragement offered, medication given as ordered  R.  Pt remains safe on the unit, will continue to monitor.

## 2016-10-23 NOTE — Progress Notes (Signed)
D.  Pt pleasant on approach, denies complaints at this time.  Pt was positive for evening wrap up group, observed interacting appropriately with peers on unit.  Pt denies SI/HI/AVH at this time.  A.  Support and encouragement offered, medication given as ordered  R.  Pt remains safe on the unit, will continue to monitor.
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
## 2016-10-23 NOTE — H&P (Signed)
Psychiatric Admission Assessment Adult  Patient Identification: Lance Park MRN:  [id number] Date of Evaluation:  10/23/2016 Chief Complaint:  Worsening depression Principal Diagnosis: MDD Recurrent Diagnosis:   Patient Active Problem List   Diagnosis Date Noted  . MDD (major depressive disorder) [F32.9] 10/22/2016  . Severe major depression without psychotic features [facility]) [F32.2] 07/07/2016   History of Present Illness: 26 y.o AAM, single, unemployed, lives with his father. Background history of MDD and THC use . Presented directly to the unit in company of his father . Reports worsening depression and suicidal thoughts. Has noty been adherent with treatment and follow up. No labs done at admission.    At interview, patient states that he went off medications after he was discharged the last time. Felt good but gradually has been feeling depressed again. Says he has lost motivation to do things. He is in bed most of the time. He walked away from his job. Says he feels hopeless and does not care about his future. Says he has lost his relationship with his girlfriend as he was no fun to be around. He ruminates a lot on negative things like being a burden to his family, feels he let everyone down and feels he is a failure. States that he has lost appetite and eats just enough to stay well. He finds it difficult to get into sleep. Has multiple awakening at night. Notes that his thoughts has been sluggish. Suicidal thoughts has been off and on in his mind. He denies any intent to harm self at this time. No violent thoughts. No homicidal thoughts. No access to weapons. No abnormal perception. No delusional preoccupation. No evidence of mania. Reports regular use of THC but does not use any other substance. Denies any legal issues. No other stressors at this time.   Total Time spent with patient: 1 hour  Past Psychiatric History: MDD and THC use disorder. Has been admitted once in the past. Was  treated with Citalopram. Says he took it for only four days. Reports overdosing twice in the past. Says he was check out at the emergency department. Never admitted medically. No past history of violent behavior. No past treatment for chemical dependency.   Is the patient at risk to self? Yes.    Has the patient been a risk to self in the past 6 months? Yes   Has the patient been a risk to self within the distant past? Yes.    Is the patient a risk to others? No.  Has the patient been a risk to others in the past 6 months? No.  Has the patient been a risk to others within the distant past? No.   Prior Inpatient Therapy: Prior Inpatient Therapy: Yes Prior Therapy Dates: June 2018 Prior Therapy Facilty/Provider(s): [facility] Reason for Treatment: Sucidal Intent Prior Outpatient Therapy: Prior Outpatient Therapy: Yes Prior Therapy Dates: last seen 3-4 months ago Prior Therapy Facilty/Provider(s): [facility] Reason for Treatment: MDD and Anxiety Does patient have an ACCT team?: No Does patient have Intensive In-House Services?  : No Does patient have [facility] services? : No Does patient have P4CC services?: No  Alcohol Screening: 1. How often do you have a drink containing alcohol?: Never 9. Have you or someone else been injured as a result of your drinking?: No 10. Has a relative or friend or a doctor or another health worker been concerned about your drinking or suggested you cut down?: No Alcohol Use Disorder Identification Test Final Score (AUDIT): 0  Brief Intervention: AUDIT score less than 7 or less-screening does not suggest unhealthy drinking-brief intervention not indicated Substance Abuse History in the last 12 months:  Yes.   Consequences of Substance Abuse:  as above  Previous Psychotropic Medications: Yes  Psychological Evaluations: Yes  Past Medical History:  Past Medical History:  Diagnosis Date  . Asthma    "As a child, I grew out of it"  . Depression    History  reviewed. No pertinent surgical history. Family History: History reviewed. No pertinent family history. Family Psychiatric  History: No family history of mental illness.  Tobacco Screening: Have you used any form of tobacco in the last 30 days? (Cigarettes, Smokeless Tobacco, Cigars, and/or Pipes): Yes Tobacco use, Select all that apply: 5 or more cigarettes per day Are you interested in Tobacco Cessation Medications?: No, patient refused Counseled patient on smoking cessation including recognizing danger situations, developing coping skills and basic information about quitting provided: Refused/Declined practical counseling Social History:  History  Alcohol Use No     History  Drug Use  . Types: Marijuana    Additional Social History: Marital status: Single    Pain Medications: see mar Prescriptions: see mar Over the Counter: see mar History of alcohol / drug use?: Yes Negative Consequences of Use: Personal relationships Name of Substance 1: marijuana 1 - Age of First Use: 14 1 - Amount (size/oz): unknown 1 - Frequency: daily 1 - Duration: ongoing 1 - Last Use / Amount: 10/21/16                  Allergies:  No Known Allergies Lab Results: No results found for this or any previous visit (from the past 48 hour(s)).  Blood Alcohol level:  Lab Results  Component Value Date   ETH <5 07/07/2016    Metabolic Disorder Labs:  Lab Results  Component Value Date   HGBA1C 5.0 07/08/2016   MPG 97 07/08/2016   No results found for: PROLACTIN Lab Results  Component Value Date   CHOL 180 07/08/2016   TRIG 69 07/08/2016   HDL 48 07/08/2016   CHOLHDL 3.8 07/08/2016   VLDL 14 07/08/2016   LDLCALC 118 (H) 07/08/2016    Current Medications: Current Facility-Administered Medications  Medication Dose Route Frequency Provider Last Rate Last Dose  . acetaminophen (TYLENOL) tablet 650 mg  650 mg Oral Q6H PRN Okonkwo, Justina A, NP      . alum & mag hydroxide-simeth  (MAALOX/MYLANTA) 200-200-20 MG/5ML suspension 30 mL  30 mL Oral Q4H PRN Okonkwo, Justina A, NP      . hydrOXYzine (ATARAX/VISTARIL) tablet 25 mg  25 mg Oral TID PRN Beryle Lathe, Justina A, NP      . Influenza vac split quadrivalent PF (FLUARIX) injection 0.5 mL  0.5 mL Intramuscular Tomorrow-1000 Lewis, Jerene Pitch, NP      . magnesium hydroxide (MILK OF MAGNESIA) suspension 30 mL  30 mL Oral Daily PRN Okonkwo, Justina A, NP      . pneumococcal 23 valent vaccine (PNU-IMMUNE) injection 0.5 mL  0.5 mL Intramuscular Tomorrow-1000 Oneta Rack, NP      . traZODone (DESYREL) tablet 50 mg  50 mg Oral QHS PRN Okonkwo, Justina A, NP   50 mg at 10/22/16 2213   PTA Medications: Prescriptions Prior to Admission  Medication Sig Dispense Refill Last Dose  . citalopram (CELEXA) 20 MG tablet Take 1 tablet (20 mg total) by mouth daily. 30 tablet 0 Unknown at Unknown time  . hydrOXYzine (ATARAX/VISTARIL)  25 MG tablet Take 1 tablet (25 mg total) by mouth 3 (three) times daily as needed for anxiety. 90 tablet 0 Unknown at Unknown time  . nicotine polacrilex (NICORETTE) 2 MG gum Take 1 each (2 mg total) by mouth as needed for smoking cessation. 100 tablet 0 Unknown at Unknown time  . traZODone (DESYREL) 50 MG tablet Take 1 tablet (50 mg total) by mouth at bedtime as needed for sleep. 30 tablet 0 Unknown at Unknown time    Musculoskeletal: Strength & Muscle Tone: within normal limits Gait & Station: normal Patient leans: N/A  Psychiatric Specialty Exam: Physical Exam  Constitutional: He is oriented to person, place, and time. He appears well-developed and well-nourished.  HENT:  Head: Normocephalic and atraumatic.  Respiratory: Effort normal.  Neurological: He is alert and oriented to person, place, and time.  Skin: Skin is warm and dry.  Psychiatric:  As above     ROS  Blood pressure 113/78, pulse 100, temperature 98 F (36.7 C), temperature source Oral, resp. rate 20, height  (1.753 m), weight 63.5  kg (140 lb), SpO2 100 %.Body mass index is 20.67 kg/m.  General Appearance: Casually dressed, calm and cooperative. Pleasant and engages well. Appropriate behavior.   Eye Contact:  Good  Speech:  Clear and Coherent and Normal Rate  Volume:  Normal  Mood:  Depressed  Affect:  Mobilizing some positive affect appropriately.   Thought Process:  Linear  Orientation:  Full (Time, Place, and Person)  Thought Content:  Rumination  Suicidal Thoughts:  None currently.  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 Perpetuated by none adherence to treatment and regular use of THC. We explored treatment options. Patient has consented to use of Sertraline after we discussed the risks and benefits.   Psychiatric: MDD THC use disorder  Medical:  Psychosocial:  Unemployed Recent end of relationship  PLAN: 1. Sertraline 25 mg daily. Would titrate gradually 2. Encourage unit groups and activities 3. Monitor mood, behavior and interaction with peers 4. Motivational enhancement  5. SW would gather collateral and facilitate aftercare.    Observation Level/Precautions:  15 minute checks  Laboratory:  CBC Chemistry Profile UDS  Psychotherapy:    Medications:    Consultations:    Discharge Concerns:    Estimated LOS:  Other:     Physician Treatment Plan for Primary Diagnosis: <principal problem not specified> Long Term Goal(s):  Improvement in symptoms so as ready for discharge  Short Term Goals: Ability to identify changes in lifestyle to reduce recurrence of condition will improve, Ability to verbalize feelings will improve, Ability to disclose and discuss suicidal ideas, Ability to demonstrate self-control will improve, Ability to identify and develop effective coping behaviors will improve, Ability to maintain clinical measurements within normal limits will improve, Compliance with prescribed medications will improve and Ability to identify triggers associated with substance abuse/mental health  issues will improve  Physician Treatment Plan for Secondary Diagnosis: Active Problems:   MDD (major depressive disorder)  Long Term Goal(s): Improvement in symptoms so as ready for discharge  Short Term Goals: Ability to identify changes in lifestyle to reduce recurrence of condition will improve, Ability to verbalize feelings will improve, Ability to disclose and discuss suicidal ideas, Ability to demonstrate self-control will improve, Ability to identify and develop effective coping behaviors will improve, Ability to maintain clinical measurements within normal limits will improve, Compliance with prescribed medications will improve and Ability to identify triggers associated with substance abuse/mental health issues will improve  I certify that inpatient services furnished can reasonably be expected to improve the patient's condition.    Georgiann Cocker, MD 10/14/201812:05 PM

## 2016-10-23 NOTE — Progress Notes (Deleted)
Data. Patient denies SI/HI/AVH. Verbally contracts for safety on the unit and to come to staff before acting of any self harm thoughts/feelings/voices.  Patient interacting well with staff and other patients.  Action. Emotional support and encouragement offered. Education provided on medication, indications and side effect. Q 15 minute checks done for safety. Response. Safety on the unit maintained through 15 minute checks.  Medications taken as prescribed. Attended groups. Remained calm and appropriate through out shift. 

## 2016-10-23 NOTE — Progress Notes (Signed)
Pt attended the evening wrap up group on the [address].  

## 2016-10-23 NOTE — BHH Group Notes (Signed)
[facility] Group Notes:  (Nursing/MHT/Case Management/Adjunct)  Date:  10/23/2016  Time:  6:53 PM  Type of Therapy:  Nurse Education  Participation Level:  Active  Participation Quality:  Appropriate and Attentive  Affect:  Appropriate  Cognitive:  Appropriate  Insight:  Appropriate  Engagement in Group:  Engaged  Modes of Intervention:  Discussion and Education  Summary of Progress/Problems:  Discussed healthy coping skills and how to change negative coping skills.   Almira Bar 10/23/2016, 6:53 PM

## 2016-10-23 NOTE — BHH Suicide Risk Assessment (Signed)
[facility] Admission Suicide Risk Assessment   Nursing information obtained from:  Patient Demographic factors:  Male, Low socioeconomic status Current Mental Status:  NA Loss Factors:  NA Historical Factors:  NA Risk Reduction Factors:  Sense of responsibility to family  Total Time spent with patient: 30 minutes Principal Problem: <principal problem not specified> Diagnosis:   Patient Active Problem List   Diagnosis Date Noted  . MDD (major depressive disorder) [F32.9] 10/22/2016  . Severe major depression without psychotic features [facility]) [F32.2] 07/07/2016   Subjective Data:  26 y.o AAM, single, unemployed, lives with his father. Background history of MDD and THC use . Presented directly to the unit in company of his father . Reports worsening depression and suicidal thoughts. Has not been adherent with treatment and follow up. No labs done at admission.  Past history of suicidal behavior. Past history of cutting self with a razor. Currently depressed. No associated psychosis or mania. No associated homicidal thoughts. No cognitive deficits. Use of THC, recent loss of job and relationship as perpetuating factors. He has agreed to treatment with Sertraline.   Continued Clinical Symptoms:  Alcohol Use Disorder Identification Test Final Score (AUDIT): 0 The "Alcohol Use Disorders Identification Test", Guidelines for Use in Primary Care, Second Edition.  World Science writer [facility]). Score between 0-7:  no or low risk or alcohol related problems. Score between 8-15:  moderate risk of alcohol related problems. Score between 16-19:  high risk of alcohol related problems. Score 20 or above:  warrants further diagnostic evaluation for alcohol dependence and treatment.   CLINICAL FACTORS:   Depression:   Hopelessness Alcohol/Substance Abuse/Dependencies   Musculoskeletal: Strength & Muscle Tone: within normal limits Gait & Station: normal Patient leans: N/A  Psychiatric Specialty  Exam: Physical Exam  ROS  Blood pressure 113/78, pulse 100, temperature 98 F (36.7 C), temperature source Oral, resp. rate 20, height  (1.753 m), weight 63.5 kg (140 lb), SpO2 100 %.Body mass index is 20.67 kg/m.  General Appearance: As in H&P  Eye Contact:  As in H&P  Speech:  As in H&P  Volume:  As in H&P  Mood:  As in H&P  Affect:  As in H&P  Thought Process:  As in H&P  Orientation:  As in H&P  Thought Content:  As in H&P  Suicidal Thoughts:  As in H&P  Homicidal Thoughts:  As in H&P  Memory:  As in H&P  Judgement:  As in H&P  Insight:  As in H&P  Psychomotor Activity:  As in H&P  Concentration:  As in H&P  Recall:  As in H&P  Fund of Knowledge:  As in H&P  Language:  As in H&P  Akathisia:  As in H&P  Handed:  As in H&P  AIMS (if indicated):     Assets:  As in H&P  ADL's:  As in H&P  Cognition:  As in H&P  Sleep:  Number of Hours: 5.5      COGNITIVE FEATURES THAT CONTRIBUTE TO RISK:  None    SUICIDE RISK:   Mild:  Suicidal ideation of limited frequency, intensity, duration, and specificity.  There are no identifiable plans, no associated intent, mild dysphoria and related symptoms, good self-control (both objective and subjective assessment), few other risk factors, and identifiable protective factors, including available and accessible social support.  PLAN OF CARE:  As in H&P  I certify that inpatient services furnished can reasonably be expected to improve the patient's condition.   Delight Ovens  Izediuno, MD 10/23/2016, 12:28 PM

## 2016-10-24 MED ORDER — SERTRALINE HCL 50 MG PO TABS
50.0000 mg | ORAL_TABLET | Freq: Every day | ORAL | Status: DC
  Administered 2016-10-25 – 2016-10-26 (×2): 50 mg via ORAL
  Filled 2016-10-24: qty 7
  Filled 2016-10-24 (×3): qty 1

## 2016-10-24 NOTE — Progress Notes (Signed)
Nursing Note 10/24/2016 [id number]  Data Reports sleeping good without PRN sleep med.  Rates depression 0/10, hopelessness 0/10, and anxiety 3/10. Affect bright and appropriate. Denies HI, SI, AVH.  Attending groups, interacting well with peers and staff.   Action Spoke with patient 1:1, nurse offered support to patient throughout shift.  Continues to be monitored on 15 minute checks for safety.  Response Remains safe and appropriate on unit.  Bright and involved in milieu.

## 2016-10-24 NOTE — Progress Notes (Signed)
Recreation Therapy Notes  Date:  10/24/16  Time: 0930 Location: [address] Dayroom  Group Topic: Stress Management  Goal Area(s) Addresses:  Patient will verbalize importance of using healthy stress management.  Patient will identify positive emotions associated with healthy stress management.   Behavioral Response: Engaged  Intervention: Stress Management  Activity :  Progressive Muscle Relaxation.  LRT introduced the stress management technique of progressive muscle relaxation.  LRT read a script to allow patients to focus on each muscle group individually to tense and then relax the muscles.  Patients were to follow along as the script was read to engage in the activity.  Education:  Stress Management, Discharge Planning.   Education Outcome: Acknowledges edcuation/In group clarification offered/Needs additional education  Clinical Observations/Feedback: Pt attended group.   Jama Krichbaum, LRT/CTRS         Shannie Kontos A 10/24/2016 11:31 AM 

## 2016-10-24 NOTE — Tx Team (Signed)
Interdisciplinary Treatment and Diagnostic Plan Update  10/24/2016 Time of Session: 0830AM Lance Park MRN: [id number]  Principal Diagnosis: MDD (major depressive disorder)  Secondary Diagnoses: Principal Problem:   MDD (major depressive disorder)   Current Medications:  Current Facility-Administered Medications  Medication Dose Route Frequency Provider Last Rate Last Dose  . acetaminophen (TYLENOL) tablet 650 mg  650 mg Oral Q6H PRN Okonkwo, Justina A, NP      . alum & mag hydroxide-simeth (MAALOX/MYLANTA) 200-200-20 MG/5ML suspension 30 mL  30 mL Oral Q4H PRN Okonkwo, Justina A, NP      . hydrOXYzine (ATARAX/VISTARIL) tablet 25 mg  25 mg Oral TID PRN Beryle Lathe, Justina A, NP      . Influenza vac split quadrivalent PF (FLUARIX) injection 0.5 mL  0.5 mL Intramuscular Tomorrow-1000 Lewis, Jerene Pitch, NP      . magnesium hydroxide (MILK OF MAGNESIA) suspension 30 mL  30 mL Oral Daily PRN Okonkwo, Justina A, NP      . pneumococcal 23 valent vaccine (PNU-IMMUNE) injection 0.5 mL  0.5 mL Intramuscular Tomorrow-1000 Oneta Rack, NP      . sertraline (ZOLOFT) tablet 25 mg  25 mg Oral Daily Izediuno, Delight Ovens, MD   25 mg at 10/24/16 0745  . traZODone (DESYREL) tablet 50 mg  50 mg Oral QHS PRN Beryle Lathe, Justina A, NP   50 mg at 10/22/16 2213   PTA Medications: Prescriptions Prior to Admission  Medication Sig Dispense Refill Last Dose  . citalopram (CELEXA) 20 MG tablet Take 1 tablet (20 mg total) by mouth daily. 30 tablet 0 Unknown at Unknown time  . hydrOXYzine (ATARAX/VISTARIL) 25 MG tablet Take 1 tablet (25 mg total) by mouth 3 (three) times daily as needed for anxiety. 90 tablet 0 Unknown at Unknown time  . nicotine polacrilex (NICORETTE) 2 MG gum Take 1 each (2 mg total) by mouth as needed for smoking cessation. 100 tablet 0 Unknown at Unknown time  . traZODone (DESYREL) 50 MG tablet Take 1 tablet (50 mg total) by mouth at bedtime as needed for sleep. 30 tablet 0 Unknown at Unknown time     Patient Stressors: Medication change or noncompliance  Patient Strengths: Ability for insight Communication skills  Treatment Modalities: Medication Management, Group therapy, Case management,  1 to 1 session with clinician, Psychoeducation, Recreational therapy.   Physician Treatment Plan for Primary Diagnosis: MDD (major depressive disorder) Long Term Goal(s): Improvement in symptoms so as ready for discharge Improvement in symptoms so as ready for discharge   Short Term Goals: Ability to identify changes in lifestyle to reduce recurrence of condition will improve Ability to verbalize feelings will improve Ability to disclose and discuss suicidal ideas Ability to demonstrate self-control will improve Ability to identify and develop effective coping behaviors will improve Ability to maintain clinical measurements within normal limits will improve Compliance with prescribed medications will improve Ability to identify triggers associated with substance abuse/mental health issues will improve Ability to identify changes in lifestyle to reduce recurrence of condition will improve Ability to verbalize feelings will improve Ability to disclose and discuss suicidal ideas Ability to demonstrate self-control will improve Ability to identify and develop effective coping behaviors will improve Ability to maintain clinical measurements within normal limits will improve Compliance with prescribed medications will improve Ability to identify triggers associated with substance abuse/mental health issues will improve  Medication Management: Evaluate patient's response, side effects, and tolerance of medication regimen.  Therapeutic Interventions: 1 to 1 sessions, Unit Group sessions and  Medication administration.  Evaluation of Outcomes: Progressing  Physician Treatment Plan for Secondary Diagnosis: Principal Problem:   MDD (major depressive disorder)  Long Term Goal(s): Improvement in  symptoms so as ready for discharge Improvement in symptoms so as ready for discharge   Short Term Goals: Ability to identify changes in lifestyle to reduce recurrence of condition will improve Ability to verbalize feelings will improve Ability to disclose and discuss suicidal ideas Ability to demonstrate self-control will improve Ability to identify and develop effective coping behaviors will improve Ability to maintain clinical measurements within normal limits will improve Compliance with prescribed medications will improve Ability to identify triggers associated with substance abuse/mental health issues will improve Ability to identify changes in lifestyle to reduce recurrence of condition will improve Ability to verbalize feelings will improve Ability to disclose and discuss suicidal ideas Ability to demonstrate self-control will improve Ability to identify and develop effective coping behaviors will improve Ability to maintain clinical measurements within normal limits will improve Compliance with prescribed medications will improve Ability to identify triggers associated with substance abuse/mental health issues will improve     Medication Management: Evaluate patient's response, side effects, and tolerance of medication regimen.  Therapeutic Interventions: 1 to 1 sessions, Unit Group sessions and Medication administration.  Evaluation of Outcomes: Progressing   RN Treatment Plan for Primary Diagnosis: MDD (major depressive disorder) Long Term Goal(s): Knowledge of disease and therapeutic regimen to maintain health will improve  Short Term Goals: Ability to remain free from injury will improve, Ability to verbalize feelings will improve and Ability to disclose and discuss suicidal ideas  Medication Management: RN will administer medications as ordered by provider, will assess and evaluate patient's response and provide education to patient for prescribed medication. RN will  report any adverse and/or side effects to prescribing provider.  Therapeutic Interventions: 1 on 1 counseling sessions, Psychoeducation, Medication administration, Evaluate responses to treatment, Monitor vital signs and CBGs as ordered, Perform/monitor CIWA, COWS, AIMS and Fall Risk screenings as ordered, Perform wound care treatments as ordered.  Evaluation of Outcomes: Progressing   LCSW Treatment Plan for Primary Diagnosis: MDD (major depressive disorder) Long Term Goal(s): Safe transition to appropriate next level of care at discharge, Engage patient in therapeutic group addressing interpersonal concerns.  Short Term Goals: Engage patient in aftercare planning with referrals and resources, Facilitate patient progression through stages of change regarding substance use diagnoses and concerns and Identify triggers associated with mental health/substance abuse issues  Therapeutic Interventions: Assess for all discharge needs, 1 to 1 time with Social worker, Explore available resources and support systems, Assess for adequacy in community support network, Educate family and significant other(s) on suicide prevention, Complete Psychosocial Assessment, Interpersonal group therapy.  Evaluation of Outcomes: Progressing   Progress in Treatment: Attending groups: Yes. Participating in groups: Yes. Taking medication as prescribed: Yes. Toleration medication: Yes. Family/Significant other contact made: No, will contact:  family member if patient consents Patient understands diagnosis: Yes. Discussing patient identified problems/goals with staff: Yes. Medical problems stabilized or resolved: Yes. Denies suicidal/homicidal ideation: Yes. Issues/concerns per patient self-inventory: No. Other: n/a   New problem(s) identified: No, Describe:  n/a  New Short Term/Long Term Goal(s): medication management for mood stabilization; elimination of SI thoughts; development of comprehensive mental wellness  plan.   Discharge Plan or Barriers: CSW assessing for appropriate referrals. Pt reports that he has no current outpatient providers and has not followed up with recommendations for outpatient care.   Reason for Continuation of Hospitalization: Depression Medication  stabilization Suicidal ideation  Estimated Length of Stay: Wed 10/26/16  Attendees: Patient: 10/24/2016 8:57 AM  Physician: Dr. Jackquline Berlin MD 10/24/2016 8:57 AM  Nursing: Horatio Pel RN 10/24/2016 8:57 AM  RN Care Manager: Onnie Boer CM 10/24/2016 8:57 AM  Social Worker: Trula Slade, LCSW 10/24/2016 8:57 AM  Recreational Therapist: x 10/24/2016 8:57 AM  Other: Armandina Stammer NP; Hillery Jacks NP; Feliz Beam Money NP 10/24/2016 8:57 AM  Other:  10/24/2016 8:57 AM  Other: 10/24/2016 8:57 AM    Scribe for Treatment Team: Ledell Peoples Smart, LCSW 10/24/2016 8:57 AM

## 2016-10-24 NOTE — [facility] Group Notes (Signed)
LCSW Group Therapy Note   10/24/2016 1:15pm   Type of Therapy and Topic:  Group Therapy:  Overcoming Obstacles   Participation Level:  Minimal   Description of Group:    In this group patients will be encouraged to explore what they see as obstacles to their own wellness and recovery. They will be guided to discuss their thoughts, feelings, and behaviors related to these obstacles. The group will process together ways to cope with barriers, with attention given to specific choices patients can make. Each patient will be challenged to identify changes they are motivated to make in order to overcome their obstacles. This group will be process-oriented, with patients participating in exploration of their own experiences as well as giving and receiving support and challenge from other group members.   Therapeutic Goals: 1. Patient will identify personal and current obstacles as they relate to admission. 2. Patient will identify barriers that currently interfere with their wellness or overcoming obstacles.  3. Patient will identify feelings, thought process and behaviors related to these barriers. 4. Patient will identify two changes they are willing to make to overcome these obstacles:      Summary of Patient Progress  Patient was attentive during group but did not actively participate in group discussion unless prompted directly. Lance Park talked about being under a lot of stress as an obstacle in the way of his recovery and mental health. Lance Park presented as reserved in the group setting with improving insight.     Therapeutic Modalities:   Cognitive Behavioral Therapy Solution Focused Therapy Motivational Interviewing Relapse Prevention Therapy  Ledell Peoples Smart, LCSW 10/24/2016 4:01 PM

## 2016-10-24 NOTE — Plan of Care (Signed)
Problem: Activity: Goal: Interest or engagement in activities will improve Outcome: Progressing Pt has been attending groups on the unit   

## 2016-10-24 NOTE — [facility] Suicide Risk Assessment (Signed)
[facility] INPATIENT:  Family/Significant Other Suicide Prevention Education  Suicide Prevention Education:  Education Completed; Jayden Kratochvil (pt's father) [phone] has been identified by the patient as the family member/significant other with whom the patient will be residing, and identified as the person(s) who will aid the patient in the event of a mental health crisis (suicidal ideations/suicide attempt).  With written consent from the patient, the family member/significant other has been provided the following suicide prevention education, prior to the and/or following the discharge of the patient.  The suicide prevention education provided includes the following:  Suicide risk factors  Suicide prevention and interventions  National Suicide Hotline telephone number  [facility] assessment telephone number  [facility] Emergency Assistance 911  [facility] and/or Residential Mobile Crisis Unit telephone number  Request made of family/significant other to:  Remove weapons (e.g., guns, rifles, knives), all items previously/currently identified as safety concern.    Remove drugs/medications (over-the-counter, prescriptions, illicit drugs), all items previously/currently identified as a safety concern.  The family member/significant other verbalizes understanding of the suicide prevention education information provided.  The family member/significant other agrees to remove the items of safety concern listed above.  Pt's father reports no concerns regarding pt safety and plans to remove any sharp objects from their home temporarily being that pt has a history of cutting. Guns are locked and pt has no way of accessing per pt's father. SPE reviewed and aftercare plan reviewed. Pt's father has no other concerns to share with CSW or MD and will allow pt to return home if he so chooses.   Nieves Chapa N Smart LCSW 10/24/2016, 2:54 PM

## 2016-10-24 NOTE — Progress Notes (Signed)
[facility] MD Progress Note  10/24/2016 11:27 AM Lance Park  MRN:  [id number] Subjective:   26 y.o AAM, single, unemployed, lives with his father. Background history of MDD and THC use . Presented directly to the unit in company of his father . Reports worsening depression and suicidal thoughts. Has noty been adherent with treatment and follow up. UDS positive for THC. Other labs are essentially normal.   Chart reviewed today. Patient discussed at team today.  Staff reports that he is pleasant. He has been interacting well with peers. No behavioral issues. No side effects from his medication. Good participation at groups.   Seen today. Reports improvement in his spirits. He is tolerating his medication well. He enjoys unit activities. He is able to focus on task. He has good appetite and energy has normalized. Says he sleeps well though the bed is not very comfortable. His family has been visiting. Says his father is coming in tonight. We discussed further titration of hs medication. Encouraged.   Principal Problem: MDD (major depressive disorder) Diagnosis:   Patient Active Problem List   Diagnosis Date Noted  . MDD (major depressive disorder) [F32.9] 10/22/2016  . 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 Asthma    "As a child, I grew out of it"  . Depression    History reviewed. No pertinent surgical history. Family History: History reviewed. No pertinent family history. Family Psychiatric  History: As in H&P Social History:  History  Alcohol Use No     History  Drug Use  . Types: Marijuana    Social History   Social History  . Marital status: Single    Spouse name: N/A  . Number of children: N/A  . Years of education: N/A   Social History Main Topics  . Smoking status: Current Every Day  Smoker    Packs/day: 0.50    Types: Cigarettes  . Smokeless tobacco: Never Used  . Alcohol use No  . Drug use: Yes    Types: Marijuana  . Sexual activity: Yes   Other Topics Concern  . None   Social History Narrative  . None   Additional Social History:    Pain Medications: see mar Prescriptions: see mar Over the Counter: see mar History of alcohol / drug use?: Yes Negative Consequences of Use: Personal relationships Name of Substance 1: marijuana 1 - Age of First Use: 14 1 - Amount (size/oz): unknown 1 - Frequency: daily 1 - Duration: ongoing 1 - Last Use / Amount: 10/21/16                  Sleep: Good  Appetite:  Good  Current Medications: Current Facility-Administered Medications  Medication Dose Route Frequency Provider Last Rate Last Dose  . acetaminophen (TYLENOL) tablet 650 mg  650 mg Oral Q6H PRN Okonkwo, Justina A, NP      . alum & mag hydroxide-simeth (MAALOX/MYLANTA) 200-200-20 MG/5ML suspension 30 mL  30 mL Oral Q4H PRN Okonkwo, Justina A, NP      . hydrOXYzine (ATARAX/VISTARIL) tablet 25 mg  25 mg Oral TID PRN Lu Duffel, Justina A, NP      . Influenza vac split quadrivalent PF (FLUARIX) injection 0.5 mL  0.5 mL Intramuscular Tomorrow-1000 Ricky Ala N, NP      . magnesium hydroxide (MILK OF  MAGNESIA) suspension 30 mL  30 mL Oral Daily PRN Okonkwo, Justina A, NP      . pneumococcal 23 valent vaccine (PNU-IMMUNE) injection 0.5 mL  0.5 mL Intramuscular Tomorrow-1000 Derrill Center, NP      . sertraline (ZOLOFT) tablet 25 mg  25 mg Oral Daily Izediuno, Laruth Bouchard, MD   25 mg at 10/24/16 0745  . traZODone (DESYREL) tablet 50 mg  50 mg Oral QHS PRN Lu Duffel, Justina A, NP   50 mg at 10/22/16 2213    Lab Results:  Results for orders placed or performed during the hospital encounter of 10/22/16 (from the past 48 hour(s))  Rapid urine drug screen (hospital performed)     Status: Abnormal   Collection Time: 10/23/16 12:14 PM  Result Value Ref Range    Opiates NONE DETECTED NONE DETECTED   Cocaine NONE DETECTED NONE DETECTED   Benzodiazepines NONE DETECTED NONE DETECTED   Amphetamines NONE DETECTED NONE DETECTED   Tetrahydrocannabinol POSITIVE (A) NONE DETECTED   Barbiturates NONE DETECTED NONE DETECTED    Comment:        DRUG SCREEN FOR MEDICAL PURPOSES ONLY.  IF CONFIRMATION IS NEEDED FOR ANY PURPOSE, NOTIFY LAB WITHIN 5 DAYS.        LOWEST DETECTABLE LIMITS FOR URINE DRUG SCREEN Drug Class       Cutoff (ng/mL) Amphetamine      1000 Barbiturate      200 Benzodiazepine   191 Tricyclics       478 Opiates          300 Cocaine          300 THC              50 Performed at [facility], [place] [address], [place], [place] [address]   CBC with Differential/Platelet     Status: Abnormal   Collection Time: 10/23/16  6:10 PM  Result Value Ref Range   WBC 8.7 4.0 - 10.5 K/uL   RBC 6.01 (H) 4.22 - 5.81 MIL/uL   Hemoglobin 15.8 13.0 - 17.0 g/dL   HCT 46.7 39.0 - 52.0 %   MCV 77.7 (L) 78.0 - 100.0 fL   MCH 26.3 26.0 - 34.0 pg   MCHC 33.8 30.0 - 36.0 g/dL   RDW 13.6 11.5 - 15.5 %   Platelets 248 150 - 400 K/uL   Neutrophils Relative % 48 %   Neutro Abs 4.3 1.7 - 7.7 K/uL   Lymphocytes Relative 29 %   Lymphs Abs 2.5 0.7 - 4.0 K/uL   Monocytes Relative 6 %   Monocytes Absolute 0.5 0.1 - 1.0 K/uL   Eosinophils Relative 16 %   Eosinophils Absolute 1.3 (H) 0.0 - 0.7 K/uL   Basophils Relative 1 %   Basophils Absolute 0.0 0.0 - 0.1 K/uL    Comment: Performed at [facility], [place] [address], [place], [place] [address]  Comprehensive metabolic panel     Status: Abnormal   Collection Time: 10/23/16  6:10 PM  Result Value Ref Range   Sodium 137 135 - 145 mmol/L   Potassium 3.9 3.5 - 5.1 mmol/L   Chloride 102 101 - 111 mmol/L   CO2 28 22 - 32 mmol/L   Glucose, Bld 108 (H) 65 - 99 mg/dL   BUN 17 6 - 20 mg/dL   Creatinine, Ser 1.24 0.61 - 1.24 mg/dL   Calcium 9.1 8.9 - 10.3 mg/dL   Total Protein  7.1 6.5 - 8.1  g/dL   Albumin 4.2 3.5 - 5.0 g/dL   AST 18 15 - 41 U/L   ALT 22 17 - 63 U/L   Alkaline Phosphatase 75 38 - 126 U/L   Total Bilirubin 0.8 0.3 - 1.2 mg/dL   GFR calc non Af Amer >60 >60 mL/min   GFR calc Af Amer >60 >60 mL/min    Comment: (NOTE) The eGFR has been calculated using the CKD EPI equation. This calculation has not been validated in all clinical situations. eGFR's persistently <60 mL/min signify possible Chronic Kidney Disease.    Anion gap 7 5 - 15    Comment: Performed at [facility], [place] [address], [place], [place] [address]    Blood Alcohol level:  Lab Results  Component Value Date   ETH <5 19/62/2297    Metabolic Disorder Labs: Lab Results  Component Value Date   HGBA1C 5.0 07/08/2016   MPG 97 07/08/2016   No results found for: PROLACTIN Lab Results  Component Value Date   CHOL 180 07/08/2016   TRIG 69 07/08/2016   HDL 48 07/08/2016   CHOLHDL 3.8 07/08/2016   VLDL 14 07/08/2016   LDLCALC 118 (H) 07/08/2016    Physical Findings: AIMS: Facial and Oral Movements Muscles of Facial Expression: None, normal Lips and Perioral Area: None, normal Jaw: None, normal Tongue: None, normal,Extremity Movements Upper (arms, wrists, hands, fingers): None, normal Lower (legs, knees, ankles, toes): None, normal, Trunk Movements Neck, shoulders, hips: None, normal, Overall Severity Severity of abnormal movements (highest score from questions above): None, normal Incapacitation due to abnormal movements: None, normal [name] awareness of abnormal movements (rate only patient's report): No Awareness, Dental Status Current problems with teeth and/or dentures?: No Does patient usually wear dentures?: No  CIWA:    COWS:     Musculoskeletal: Strength & Muscle Tone: within normal limits Gait & Station: normal Patient leans: N/A  Psychiatric Specialty Exam: Physical Exam  Constitutional: He is oriented to person, place, and time.  He appears well-developed and well-nourished.  HENT:  Head: Normocephalic and atraumatic.  Respiratory: Effort normal.  Neurological: He is alert and oriented to person, place, and time.  Psychiatric:  As above     ROS  Blood pressure 112/77, pulse 69, temperature 98.8 F (37.1 C), temperature source Oral, resp. rate 16, height 5' 9"  (1.753 m), weight 63.5 kg (140 lb), SpO2 100 %.Body mass index is 20.67 kg/m.  General Appearance: pleasant, calm and cooperative. Relates warmly.   Eye Contact:  Good  Speech:  Clear and Coherent and Normal Rate  Volume:  Normal  Mood:  Depression is lifting  Affect:  Appropriate and Full Range  Thought Process:  Linear  Orientation:  Full (Time, Place, and Person)  Thought Content:  No delusional theme. No preoccupation with violent thoughts. No negative ruminations. No obsession.  No hallucination in any modality.   Suicidal Thoughts:  No  Homicidal Thoughts:  No  Memory:  Immediate;   Good Recent;   Good Remote;   Good  Judgement:  Good  Insight:  Good  Psychomotor Activity:  Normal  Concentration:  Concentration: Good and Attention Span: Good  Recall:  Good  Fund of Knowledge:  Good  Language:  Good  Akathisia:  Negative  Handed:    AIMS (if indicated):     Assets:  Communication Skills Desire for Improvement Housing Physical Health Resilience Talents/Skills Transportation Vocational/Educational  ADL's:  Intact  Cognition:  WNL  Sleep:  Number of Hours:  6    Treatment Plan Summary: Depression is responding to treatment. No current suicidal or homicidal thoughts. We have agreed to titrate his dose further. Needs further evaluation and hopefuls discharge in a day or two.    Psychiatric: MDD THC use disorder  Medical:  Psychosocial:  Unemployed Recent end of relationship  PLAN: 1. Increase Sertraline to 50 mg daily.  2. Continue to monitor mood, behavior and interaction with peers   Artist [place],  MD 10/24/2016, 11:27 AM

## 2016-10-25 DIAGNOSIS — F1290 Cannabis use, unspecified, uncomplicated: Secondary | ICD-10-CM

## 2016-10-25 NOTE — Progress Notes (Signed)
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  He attended evening group, then spent the rest of the evening playing cards with peers.  He denies SI/HI/AVH.  He voices no needs or concerns at this time.  He has been pleasant and appropriate with staff.  He states he does not need a sleep aid this evening.  He is hopeful to discharge Tuesday or Wednesday.  Support and encouragement offered.  Discharge plans are in process.  Safety maintained with q15 minute checks.

## 2016-10-25 NOTE — Progress Notes (Signed)
[facility] MD Progress Note  10/25/2016 5:16 PM Lance Park  MRN:  [id number]  Subjective: Lance reports, "I'm feeling a lot better. I feel like I'm getting a break from my triggers for being here. I'm tolerating my medicines.".   Objective: 26 y.o AAM, single, unemployed, lives with his father. Background history of MDD and THC use . Presented directly to the unit in company of his father . Reports worsening depression and suicidal thoughts. Has noty been adherent with treatment and follow up. UDS positive for THC. Other labs are essentially normal.   Chart reviewed today. Patient discussed at team today.  Staff reports that he is pleasant. He has been interacting well with peers. No behavioral issues. No side effects from his medication. Good participation at groups.   Seen today. Reports improvement in his spirits. He is tolerating his medication well. He enjoys unit activities. He is able to focus on task. He has good appetite and energy has normalized. Says he sleeps well though the bed is not very comfortable. His family has been visiting. Says his father is coming in tonight. We discussed further titration of hs medication. Encouraged.   Principal Problem: MDD (major depressive disorder) Diagnosis:   Patient Active Problem List   Diagnosis Date Noted  . MDD (major depressive disorder) [F32.9] 10/22/2016  . Severe major depression without psychotic features ([place]) [F32.2] 07/07/2016   Total Time spent with patient: 15 minutes  Past Psychiatric History: As in H&P  Past Medical History:  Past Medical History:  Diagnosis Date  . Asthma    "As a child, I grew out of it"  . Depression    History reviewed. No pertinent surgical history.  Family History: History reviewed. No pertinent family history.  Family Psychiatric  History: As in H&P  Social History:  History  Alcohol Use No     History  Drug Use  . Types: Marijuana    Social History   Social History  . Marital  status: Single    Spouse name: N/A  . Number of children: N/A  . Years of education: N/A   Social History Main Topics  . Smoking status: Current Every Day Smoker    Packs/day: 0.50    Types: Cigarettes  . Smokeless tobacco: Never Used  . Alcohol use No  . Drug use: Yes    Types: Marijuana  . Sexual activity: Yes   Other Topics Concern  . None   Social History Narrative  . None   Additional Social History:    Pain Medications: see mar Prescriptions: see mar Over the Counter: see mar History of alcohol / drug use?: Yes Negative Consequences of Use: Personal relationships Name of Substance 1: marijuana 1 - Age of First Use: 14 1 - Amount (size/oz): unknown 1 - Frequency: daily 1 - Duration: ongoing 1 - Last Use / Amount: 10/21/16  Sleep: Good  Appetite:  Good  Current Medications: Current Facility-Administered Medications  Medication Dose Route Frequency Provider Last Rate Last Dose  . acetaminophen (TYLENOL) tablet 650 mg  650 mg Oral Q6H PRN Okonkwo, Justina A, NP      . alum & mag hydroxide-simeth (MAALOX/MYLANTA) 200-200-20 MG/5ML suspension 30 mL  30 mL Oral Q4H PRN Okonkwo, Justina A, NP      . hydrOXYzine (ATARAX/VISTARIL) tablet 25 mg  25 mg Oral TID PRN Lu Duffel, Justina A, NP      . Influenza vac split quadrivalent PF (FLUARIX) injection 0.5 mL  0.5 mL Intramuscular Tomorrow-1000 Lewis,  Marcy Panning, NP      . magnesium hydroxide (MILK OF MAGNESIA) suspension 30 mL  30 mL Oral Daily PRN Okonkwo, Justina A, NP      . pneumococcal 23 valent vaccine (PNU-IMMUNE) injection 0.5 mL  0.5 mL Intramuscular Tomorrow-1000 Derrill Center, NP      . sertraline (ZOLOFT) tablet 50 mg  50 mg Oral Daily Izediuno, Laruth Bouchard, MD   50 mg at 10/25/16 0809  . traZODone (DESYREL) tablet 50 mg  50 mg Oral QHS PRN Lu Duffel, Justina A, NP   50 mg at 10/22/16 2213    Lab Results:  Results for orders placed or performed during the hospital encounter of 10/22/16 (from the past 48 hour(s))   CBC with Differential/Platelet     Status: Abnormal   Collection Time: 10/23/16  6:10 PM  Result Value Ref Range   WBC 8.7 4.0 - 10.5 K/uL   RBC 6.01 (H) 4.22 - 5.81 MIL/uL   Hemoglobin 15.8 13.0 - 17.0 g/dL   HCT 46.7 39.0 - 52.0 %   MCV 77.7 (L) 78.0 - 100.0 fL   MCH 26.3 26.0 - 34.0 pg   MCHC 33.8 30.0 - 36.0 g/dL   RDW 13.6 11.5 - 15.5 %   Platelets 248 150 - 400 K/uL   Neutrophils Relative % 48 %   Neutro Abs 4.3 1.7 - 7.7 K/uL   Lymphocytes Relative 29 %   Lymphs Abs 2.5 0.7 - 4.0 K/uL   Monocytes Relative 6 %   Monocytes Absolute 0.5 0.1 - 1.0 K/uL   Eosinophils Relative 16 %   Eosinophils Absolute 1.3 (H) 0.0 - 0.7 K/uL   Basophils Relative 1 %   Basophils Absolute 0.0 0.0 - 0.1 K/uL    Comment: Performed at [facility], [place] [address] [place], [place] [address]  Comprehensive metabolic panel     Status: Abnormal   Collection Time: 10/23/16  6:10 PM  Result Value Ref Range   Sodium 137 135 - 145 mmol/L   Potassium 3.9 3.5 - 5.1 mmol/L   Chloride 102 101 - 111 mmol/L   CO2 28 22 - 32 mmol/L   Glucose, Bld 108 (H) 65 - 99 mg/dL   BUN 17 6 - 20 mg/dL   Creatinine, Ser 1.24 0.61 - 1.24 mg/dL   Calcium 9.1 8.9 - 10.3 mg/dL   Total Protein 7.1 6.5 - 8.1 g/dL   Albumin 4.2 3.5 - 5.0 g/dL   AST 18 15 - 41 U/L   ALT 22 17 - 63 U/L   Alkaline Phosphatase 75 38 - 126 U/L   Total Bilirubin 0.8 0.3 - 1.2 mg/dL   GFR calc non Af Amer >60 >60 mL/min   GFR calc Af Amer >60 >60 mL/min    Comment: (NOTE) The eGFR has been calculated using the CKD EPI equation. This calculation has not been validated in all clinical situations. eGFR's persistently <60 mL/min signify possible Chronic Kidney Disease.    Anion gap 7 5 - 15    Comment: Performed at [facility], [place] [address] [place], [place] [address]   Blood Alcohol level:  Lab Results  Component Value Date   ETH <5 88/82/8003   Metabolic Disorder Labs: Lab Results  Component  Value Date   HGBA1C 5.0 07/08/2016   MPG 97 07/08/2016   No results found for: PROLACTIN Lab Results  Component Value Date   CHOL 180 07/08/2016   TRIG 69 07/08/2016  HDL 48 07/08/2016   CHOLHDL 3.8 07/08/2016   VLDL 14 07/08/2016   LDLCALC 118 (H) 07/08/2016   Physical Findings: AIMS: Facial and Oral Movements Muscles of Facial Expression: None, normal Lips and Perioral Area: None, normal Jaw: None, normal Tongue: None, normal,Extremity Movements Upper (arms, wrists, hands, fingers): None, normal Lower (legs, knees, ankles, toes): None, normal, Trunk Movements Neck, shoulders, hips: None, normal, Overall Severity Severity of abnormal movements (highest score from questions above): None, normal Incapacitation due to abnormal movements: None, normal [name] awareness of abnormal movements (rate only patient's report): No Awareness, Dental Status Current problems with teeth and/or dentures?: No Does patient usually wear dentures?: No  CIWA:    COWS:     Musculoskeletal: Strength & Muscle Tone: within normal limits Gait & Station: normal Patient leans: N/A  Psychiatric Specialty Exam: Physical Exam  Constitutional: He is oriented to person, place, and time. He appears well-developed and well-nourished.  HENT:  Head: Normocephalic and atraumatic.  Respiratory: Effort normal.  Neurological: He is alert and oriented to person, place, and time.  Psychiatric:  As above     ROS  Blood pressure 117/88, pulse 70, temperature 98.4 F (36.9 C), temperature source Oral, resp. rate 16, height 5' 9" (1.753 m), weight 63.5 kg (140 lb), SpO2 100 %.Body mass index is 20.67 kg/m.  General Appearance: pleasant, calm and cooperative. Relates warmly.   Eye Contact:  Good  Speech:  Clear and Coherent and Normal Rate  Volume:  Normal  Mood:  Depression is lifting  Affect:  Appropriate and Full Range  Thought Process:  Linear  Orientation:  Full (Time, Place, and Person)  Thought  Content:  No delusional theme. No preoccupation with violent thoughts. No negative ruminations. No obsession.  No hallucination in any modality.   Suicidal Thoughts:  No  Homicidal Thoughts:  No  Memory:  Immediate;   Good Recent;   Good Remote;   Good  Judgement:  Good  Insight:  Good  Psychomotor Activity:  Normal  Concentration:  Concentration: Good and Attention Span: Good  Recall:  Good  Fund of Knowledge:  Good  Language:  Good  Akathisia:  Negative  Handed:    AIMS (if indicated):     Assets:  Communication Skills Desire for Improvement Housing Physical Health Resilience Talents/Skills Transportation Vocational/Educational  ADL's:  Intact  Cognition:  WNL  Sleep:  Number of Hours: 6.5   Treatment Plan Summary: Depression is responding to treatment. No current suicidal or homicidal thoughts. We have agreed to titrate his dose further. Needs further evaluation and hopefuls discharge in a day or two.    Psychiatric: MDD THC use disorder  Medical:  Psychosocial:  Unemployed Recent end of relationship  PLAN: 1. Continue Sertraline 50 mg daily for depression.  2. Continue to monitor mood, behavior and interaction with peers  Lindell Spar I, NP 10/25/2016, 5:16 PMPatient ID: Lance Park, male   DOB: 23-Oct-1990, 26 y.o.   MRN: [id number]

## 2016-10-25 NOTE — Progress Notes (Signed)
Adult Psychoeducational Group Note  Date:  10/25/2016 Time:  2:29 AM  Group Topic/Focus:  Wrap-Up Group:   The focus of this group is to help patients review their daily goal of treatment and discuss progress on daily workbooks.  Participation Level:  Active  Participation Quality:  Appropriate  Affect:  Appropriate  Cognitive:  Appropriate  Insight: Appropriate  Engagement in Group:  Engaged  Modes of Intervention:  Discussion  Additional Comments:  Pt stated his goal for today was to stay positive and focus on his treatment. Pt stated he accomplished his goal today. Pt rated his overall day a 9 out of 10. Pt stated he attended all groups held today.  Felipa Furnace 10/25/2016, 2:29 AM

## 2016-10-25 NOTE — Progress Notes (Signed)
Recreation Therapy Notes  Animal-Assisted Activity (AAA) Program Checklist/Progress Notes Patient Eligibility Criteria Checklist & Daily Group note for Rec TxIntervention  Date: 10.16.2018 Time: 2:45pm Location: [address]   AAA/T Program Assumption of Risk Form signed by Patient/ or Parent Legal Guardian Yes  Patient is free of allergies or sever asthma Yes  Patient reports no fear of animals Yes  Patient reports no history of cruelty to animals Yes  Patient understands his/her participation is voluntary Yes  Patient washes hands before animal contact Yes  Patient washes hands after animal contact Yes  Behavioral Response: Appropriate   Education:Hand Washing, Appropriate Animal Interaction   Education Outcome: Acknowledges education.   Clinical Observations/Feedback: Patient attended session and interacted appropriately with therapy dog and peers. Patient asked appropriate questions about therapy dog and his training. Patient shared stories about their pets at home with group.   Marykay Lex Nautica Hotz, LRT/CTRS        Vivianna Piccini L 10/25/2016 3:00 PM

## 2016-10-25 NOTE — Progress Notes (Signed)
D: Patient observed to be active in the milieu.  [name] affect anxious, animated with congruent mood. Pleasant and cooperative though forwards minimal information 1:1. Per self inventory and discussions with writer, rates depression, hopelessness and anxiety all at a 0/10. Rates sleep as good, appetite as good, energy as normal and concentration as good.  States goal for today is to "stay positive, coping skills, positive reinforcement." Denies pain, physical problems.   A: Medicated per orders, no prns requested or required. Level III obs in place for safety. Emotional support offered and self inventory reviewed. Encouraged completion of Suicide Safety Plan and programming participation. Discussed POC with MD, SW.   R: Patient verbalizes understanding of POC. Patient denies SI/HI/AVH and remains safe on level III obs. Will continue to monitor closely and make verbal contact frequently. Tentative discharge for tomorrow.

## 2016-10-25 NOTE — [facility] Group Notes (Signed)
LCSW Group Therapy Note  10/25/2016 1:15pm  Type of Therapy/Topic:  Group Therapy:  Feelings about Diagnosis  Participation Level:  Active   Description of Group:   This group will allow patients to explore their thoughts and feelings about diagnoses they have received. Patients will be guided to explore their level of understanding and acceptance of these diagnoses. Facilitator will encourage patients to process their thoughts and feelings about the reactions of others to their diagnosis and will guide patients in identifying ways to discuss their diagnosis with significant others in their lives. This group will be process-oriented, with patients participating in exploration of their own experiences, giving and receiving support, and processing challenge from other group members.   Therapeutic Goals: 1. Patient will demonstrate understanding of diagnosis as evidenced by identifying two or more symptoms of the disorder 2. Patient will be able to express two feelings regarding the diagnosis 3. Patient will demonstrate their ability to communicate their needs through discussion and/or role play  Summary of Patient Progress:   Patient was attentive and engaged during today's processing group. He shared that his family/friends do not understand his mental illness on a level that is supportive to him. He admitted the need for support and understanding into his depression. [name] goal is to increase his social support network after discharge. Lance Park continues to show progress in the group setting with improving insight.     Therapeutic Modalities:   Cognitive Behavioral Therapy Brief Therapy Feelings Identification    Ledell Peoples Smart, LCSW 10/25/2016 3:06 PM

## 2016-10-25 NOTE — Plan of Care (Signed)
Problem: Education: Goal: Verbalization of understanding the information provided will improve Outcome: Completed/Met Date Met: 10/25/16 Patient verbalizes understanding of information, education provided.  Problem: Medication: Goal: Compliance with prescribed medication regimen will improve Outcome: Completed/Met Date Met: 10/25/16 Patient is med compliant.

## 2016-10-26 DIAGNOSIS — G4700 Insomnia, unspecified: Secondary | ICD-10-CM

## 2016-10-26 DIAGNOSIS — F1910 Other psychoactive substance abuse, uncomplicated: Secondary | ICD-10-CM

## 2016-10-26 DIAGNOSIS — F1210 Cannabis abuse, uncomplicated: Secondary | ICD-10-CM

## 2016-10-26 MED ORDER — TRAZODONE HCL 50 MG PO TABS: 50.0000 mg | ORAL_TABLET | Freq: Every evening | ORAL | 0 refills | Status: DC | PRN

## 2016-10-26 MED ORDER — HYDROXYZINE HCL 25 MG PO TABS: 25.0000 mg | ORAL_TABLET | Freq: Three times a day (TID) | ORAL | 0 refills | Status: DC | PRN

## 2016-10-26 MED ORDER — SERTRALINE HCL 50 MG PO TABS: 50.0000 mg | ORAL_TABLET | Freq: Every day | ORAL | 0 refills | Status: DC

## 2016-10-26 NOTE — Progress Notes (Signed)
Pt observed in the 400 dayroom playing cards and talking with peers.  He reports he is doing fine and feels his meds are working for him.  He denies SI/HI/AVH.  He voices no needs or concerns at this time.  He is pleasant and cooperative with staff.  He hopes to discharge soon.  Support and encouragement offered.  Discharge plans are in process.  Pt plans to return home to stay with his father.  Safety maintained with q15 minute checks.

## 2016-10-26 NOTE — Progress Notes (Signed)
Discharge note:  Patient discharged home per MD order.  Patient will follow up with [name], social worker and [facility].  He received all his personal belongings from the unit.  Patient did not have any belongings in the locker.  Reviewed AVS/transition record with patient and he indicated understanding. Patient denies any thoughts of self harm.  Patient left with prescriptions and samples of his medications.  Patient left ambulatory with his father.

## 2016-10-26 NOTE — Progress Notes (Signed)
  [facility] :  Will you be returning to the same living situation after discharge:  Yes,  home with dad At discharge, do you have transportation home?: Yes,  dad Do you have the ability to pay for your medications: Yes,  [employer]  Release of information consent forms completed and submitted to medical records by CSW.  Patient to Follow up at: Follow-up Information    Dondra SpryGail Chestnutt,therapist Follow up.   Why:  Social worker message for Dondra SpryGail to contact you directly if an appt is not provided by your discharge time. Thank you.  Contact information: [address]  [place], [place] [id number] [phone]        [facility] Follow up on 10/28/2016.   Why:  Hospital follow-up on Friday at 1:45PM. Please bring the following if you have it: Photo ID, insurance card if you have it, and hospital discharge paperwork. Thank you. Contact information: [address] [place] [id number] [phone]           Next level of care provider [facility] Link:no  Safety Planning and Suicide Prevention discussed: Yes,  SPE completed with pt's father. SPI pamphlet and Mobile Crisis information also provided to pt.   Have you used any form of tobacco in the last 30 days? (Cigarettes, Smokeless Tobacco, Cigars, and/or Pipes): Yes  Has patient been referred to the Quitline?: Patient refused referral  Patient has been referred for addiction treatment: Yes  Pulte HomesHeather N Smart, LCSW 10/26/2016, 10:17 AM

## 2016-10-26 NOTE — Discharge Summary (Signed)
Physician Discharge Summary Note  Patient:  Lance Park is an 26 y.o., male MRN:  [id number] DOB:  [birth year] Patient phone:  [phone] (home)  Patient address:   [address] [place] [address],  Total Time spent with patient: Greater than 30 minutes  Date of Admission:  10/22/2016 Date of Discharge: 10-26-16  Reason for Admission: Worsening depression & suicidal thoughts.   Principal Problem: MDD (major depressive disorder)  Discharge Diagnoses: Patient Active Problem List   Diagnosis Date Noted  . MDD (major depressive disorder) [F32.9] 10/22/2016  . Severe major depression without psychotic features (HCC) [F32.2] 07/07/2016   Past Psychiatric History: See H&P  Past Medical History:  Past Medical History:  Diagnosis Date  . Asthma    "As a child, I grew out of it"  . Depression    History reviewed. No pertinent surgical history.  Family History: History reviewed. No pertinent family history.  Family Psychiatric  History: See H&P  Social History:  History  Alcohol Use No     History  Drug Use  . Types: Marijuana    Social History   Social History  . Marital status: Single    Spouse name: N/A  . Number of children: N/A  . Years of education: N/A   Social History Main Topics  . Smoking status: Current Every Day Smoker    Packs/day: 0.50    Types: Cigarettes  . Smokeless tobacco: Never Used  . Alcohol use No  . Drug use: Yes    Types: Marijuana  . Sexual activity: Yes   Other Topics Concern  . None   Social History Narrative  . None   Hospital Course:  26 y.o AAM, single, unemployed, lives with his father. Background history of MDD and THC use . Presented directly to the unit in company of his father . Reports worsening depression and suicidal thoughts. Has noty been adherent with treatment and follow up. No labs done at admission.  At interview, patient states that he went off medications after he was discharged the last time. Felt  good but gradually has been feeling depressed again. Says he has lost motivation to do things. He is in bed most of the time. He walked away from his job. Says he feels hopeless and does not care about his future. Says he has lost his relationship with his girlfriend as he was no fun to be around. He ruminates a lot on negative things like being a burden to his family, feels he let everyone down and feels he is a failure. States that he has lost appetite and eats just enough to stay well. He finds it difficult to get into sleep. Has multiple awakening at night. Notes that his thoughts has been sluggish. Suicidal thoughts has been off and on in his mind. He denies any intent to harm self at this time. No violent thoughts. No homicidal thoughts. No access to weapons. No abnormal perception. No delusional preoccupation. No evidence of mania. Reports regular use of THC but does not use any other substance. Denies any legal issues. No other stressors at this time.   Lance was re-admitted to the hospital for worsening symptoms of depression & crisis management due to suicidal thoughts. He was recently discharged from the hospital after receiving treatment for depression. He was discharged with a referral & an appointment for an outpatient psychiatric clinic for mental health follow-up care & medication managment. He was again in need of mood stabilization treatment.  After his present admission assessment, Lance Park's presenting symptoms were identified. The medication regimen targeting those symptoms were initiated. He was medicated & discharged on; Sertraline 50 mg for depression, Hydroxyzine 25 mg prn for anxiety & Trazodone 50 mg for insomnia. He presented no other significant pre-existing medical problems that required treatment. [name] was enrolled & participated in the group counseling sessions being offered & held on this unit. He learned coping skills. He tolerated his treatment regimen without any adverse  effects or reactions reported.  During the course of his hospitalization, Lance Park's improvement was monitored by observation & his daily report of symptom reduction noted.  His emotional and mental status were monitored by daily self-inventory reports completed by him & the clinical staff. He was evaluated daily by the treatment team for mood stability and plans for continued recovery after discharge. His motivation was an integral factor in his mood stability. He was offered further treatment options upon discharge on an outpatient basis as noted below. He was provided with all the pertinent information needed to make this appointment without problems.   Upon discharge, [name] was both mentally and medically stable. He denies suicidal/homicidal ideations, auditory/visual/tactile hallucinations, delusional thoughts or paranoia. [facility] in no apparent distress. Transportation father. Physical Findings: AIMS: Facial and Oral Movements Muscles of Facial Expression: None, normal Lips and Perioral Area: None, normal Jaw: None, normal Tongue: None, normal,Extremity Movements Upper (arms, wrists, hands, fingers): None, normal Lower (legs, knees, ankles, toes): None, normal, Trunk Movements Neck, shoulders, hips: None, normal, Overall Severity Severity of abnormal movements (highest score from questions above): None, normal Incapacitation due to abnormal movements: None, normal [name] awareness of abnormal movements (rate only patient's report): No Awareness, Dental Status Current problems with teeth and/or dentures?: No Does patient usually wear dentures?: No  CIWA:    COWS:     Musculoskeletal: Strength & Muscle Tone: within normal limits Gait & Station: normal Patient leans: N/A   Psychiatric Specialty Exam: Physical Exam  Constitutional: He appears well-developed.  HENT:  Head: Normocephalic.  Eyes: Pupils are equal, round, and reactive to light.  Neck: Normal  range of motion.  Cardiovascular: Normal rate.   Respiratory: Effort normal.  GI: Soft.  Genitourinary:  Genitourinary Comments: Deferred  Musculoskeletal: Normal range of motion.  Neurological: He is alert.  Skin: Skin is warm.    Review of Systems  Constitutional: Negative.   HENT: Negative.   Eyes: Negative.   Respiratory: Negative.   Cardiovascular: Negative.   Gastrointestinal: Negative.   Genitourinary: Negative.   Musculoskeletal: Negative.   Skin: Negative.   Neurological: Negative.   Endo/Heme/Allergies: Negative.   Psychiatric/Behavioral: Positive for depression (Stable) and substance abuse (Hx. THC use disorder). Negative for hallucinations, memory loss and suicidal ideas. The patient has insomnia (Stable). The patient is not nervous/anxious.   All other systems reviewed and are negative.   Blood pressure (!) 123/91, pulse 83, temperature 98.3 F (36.8 C), temperature source Oral, resp. rate 12, height 5\' 9"  (1.753 m), weight 63.5 kg (140 lb), SpO2 100 %.Body mass index is 20.67 kg/m.  See Md's SRA  Have you used any form of tobacco in the last 30 days? (Cigarettes, Smokeless Tobacco, Cigars, and/or Pipes): Yes  Has this patient used any form of tobacco in the last 30 days? (Cigarettes, Smokeless Tobacco, Cigars, and/or Pipes)N/A  Blood Alcohol level:  Lab Results  Component Value Date   ETH <5 07/07/2016   Metabolic Disorder Labs:  Lab Results  Component  Value Date   HGBA1C 5.0 07/08/2016   MPG 97 07/08/2016   No results found for: PROLACTIN Lab Results  Component Value Date   CHOL 180 07/08/2016   TRIG 69 07/08/2016   HDL 48 07/08/2016   CHOLHDL 3.8 07/08/2016   VLDL 14 07/08/2016   LDLCALC 118 (H) 07/08/2016   See Psychiatric Specialty Exam and Suicide Risk Assessment completed by Attending Physician prior to discharge.  Discharge destination:  Home  Is patient on multiple antipsychotic therapies at discharge:  No   Has Patient had three or  more failed trials of antipsychotic monotherapy by history:  No  Recommended Plan for Multiple Antipsychotic Therapies: NA  Allergies as of 10/26/2016   No Known Allergies     Medication List    STOP taking these medications   citalopram 20 MG tablet Commonly known as:  CELEXA   nicotine polacrilex 2 MG gum Commonly known as:  NICORETTE     TAKE these medications     Indication  hydrOXYzine 25 MG tablet Commonly known as:  ATARAX/VISTARIL Take 1 tablet (25 mg total) by mouth 3 (three) times daily as needed for anxiety.  Indication:  Feeling Anxious   sertraline 50 MG tablet Commonly known as:  ZOLOFT Take 1 tablet (50 mg total) by mouth daily. For depression  Indication:  Major Depressive Disorder   traZODone 50 MG tablet Commonly known as:  DESYREL Take 1 tablet (50 mg total) by mouth at bedtime as needed for sleep.  Indication:  Trouble Sleeping      Follow-up Information    Dondra Spry Chestnutt,therapist Follow up.   Why:  left message for Dondra Spry 2:25PM on 10/15 to request appt.  Contact information: [address]  [place], [place] [address] [phone]        [facility] Follow up on 10/28/2016.   Why:  Hospital follow-up on Friday at 1:45PM. Please bring the following if you have it: Photo ID, insurance card if you have it, and hospital discharge paperwork. Thank you. Contact information: [address] [place] [address] [phone]          Follow-up recommendations: Activity:  As tolerated Diet: As recommended by your primary care doctor. Keep all scheduled follow-up appointments as recommended.   Comments: Patient is instructed prior to discharge to: Take all medications as prescribed by his/her mental healthcare provider. Report any adverse effects and or reactions from the medicines to his/her outpatient provider promptly. Patient has been instructed & cautioned: To not engage in alcohol and or illegal drug use while on  prescription medicines. In the event of worsening symptoms, patient is instructed to call the crisis hotline, 911 and or go to the nearest ED for appropriate evaluation and treatment of symptoms. To follow-up with his/her primary care provider for your other medical issues, concerns and or health care needs.    Signed: Sanjuana Kava, NP, PMHNP, FNP_ 10/26/2016, 10:01 AM   Patient seen, Suicide Assessment Completed.  Disposition Plan Reviewed   Pt is 26 y/o M who presented with worsening depression and SI to OD.   Pt evaluated and reports improvement of mood symptoms. He notes that when he initially came in he "wanted to isolate" and had guilty feelings associated with ongoing SI. He now feels that he is "taking a different path" and he is highly motivated for outpatient follow up and to seek new employment. He denies SI/HI/AH/VH. He reports tolerating medication of zoloft 50mg  qDay without difficulty  and he would like to continue. He has follow up arranged with his therapist, Duffy Rhody, and through [place]. He was able to engage in safety planning including that he would contact emergency services if he felt unable to maintain his own safety. He plans for his father to pick him up today,    Plan Of Care/Follow-up recommendations:   -Cont Zoloft 50mg  qDay - Outpt follow up as above   Activity:  as tolerated Diet:  full Tests:  NA Other:  see above for plan  Micheal Likens, MD

## 2016-10-26 NOTE — [facility] Suicide Risk Assessment (Signed)
[facility]   Principal Problem: MDD (major depressive disorder) Discharge Diagnoses:  Patient Active Problem List   Diagnosis Date Noted  . MDD (major depressive disorder) [F32.9] 10/22/2016  . Severe major depression without psychotic features (HCC) [F32.2] 07/07/2016    Total Time spent with patient: 30 minutes  Musculoskeletal: Strength & Muscle Tone: within normal limits Gait & Station: normal Patient leans: N/A  Psychiatric Specialty Exam: Review of Systems  Constitutional: Negative.   Respiratory: Negative.   Cardiovascular: Negative.   Gastrointestinal: Negative.   Skin: Negative.   Neurological: Negative.     Blood pressure (!) 123/91, pulse 83, temperature 98.3 F (36.8 C), temperature source Oral, resp. rate 12, height 5\' 9"  (1.753 m), weight 63.5 kg (140 lb), SpO2 100 %.Body mass index is 20.67 kg/m.  General Appearance: Well Groomed  Patent attorneyye Contact::  Good  Speech:  Clear and Coherent409  Volume:  Normal  Mood:  Euthymic  Affect:  Congruent and Full Range  Thought Process:  Coherent  Orientation:  Full (Time, Place, and Person)  Thought Content:  Logical  Suicidal Thoughts:  No  Homicidal Thoughts:  No  Memory:  Immediate;   Good Recent;   Good Remote;   Good  Judgement:  Good  Insight:  Good  Psychomotor Activity:  Normal  Concentration:  Good  Recall:  Good  Fund of Knowledge:Good  Language: Good  Akathisia:  No  Handed:    AIMS (if indicated):     Assets:  Communication Skills Desire for Improvement  Sleep:  Number of Hours: 6.5  Cognition: WNL  ADL's:  Intact   Mental Status Per Nursing Assessment::   On Admission:  NA  Demographic Factors:  Male and Adolescent or young adult  Loss Factors: Decrease in vocational status  Historical Factors: Prior suicide attempts  Risk Reduction Factors:   Sense of responsibility to family and Living with another person, especially a relative  Continued Clinical  Symptoms:  Severe Anxiety and/or Agitation Depression:   Anhedonia  Cognitive Features That Contribute To Risk:  None    Suicide Risk:  Minimal: No identifiable suicidal ideation.  Patients presenting with no risk factors but with morbid ruminations; may be classified as minimal risk based on the severity of the depressive symptoms  Follow-up Information    Dondra SpryGail Chestnutt,therapist Follow up.   Why:  Social worker message for Dondra SpryGail to contact you directly if an appt is not provided by your discharge time. Thank you.  Contact information: [address]  [place], [place] [id number] [phone]        [facility] Follow up on 10/28/2016.   Why:  Hospital follow-up on Friday at 1:45PM. Please bring the following if you have it: Photo ID, insurance card if you have it, and hospital discharge paperwork. Thank you. Contact information: [address] [place] [address] [phone]           Subjective data: Pt is 26 y/o M who presented with worsening depression and SI to OD.   Pt evaluated and reports improvement of mood symptoms. He notes that when he initially came in he "wanted to isolate" and had guilty feelings associated with ongoing SI. He now feels that he is "taking a different path" and he is highly motivated for outpatient follow up and to seek new employment. He denies SI/HI/AH/VH. He reports tolerating medication of zoloft 50mg  qDay without difficulty and he would like to continue. He has follow up  arranged with his therapist, Duffy Rhody, and through [facility]. He was able to engage in safety planning including that he would contact emergency services if he felt unable to maintain his own safety. He plans for his father to pick him up today,    Plan Of Care/Follow-up recommendations:   -Cont Zoloft 50mg  qDay - Outpt follow up as above   Activity:  as tolerated Diet:  full Tests:  NA Other:  see above for plan  Micheal Likens, MD 10/26/2016, 11:05 AM

## 2016-10-26 NOTE — Progress Notes (Signed)
Adult Psychoeducational Group Note  Date:  10/26/2016 Time:  5:07 AM  Group Topic/Focus:  Wrap-Up Group:   The focus of this group is to help patients review their daily goal of treatment and discuss progress on daily workbooks.  Participation Level:  Active  Participation Quality:  Appropriate  Affect:  Appropriate  Cognitive:  Appropriate  Insight: Appropriate  Engagement in Group:  Engaged  Modes of Intervention:  Discussion  Additional Comments:  Pt stated his goal for today was make everyone feel welcome. Pt stated he accomplished his goal today. Pt rated his overall day a 9 out of 10. Pt stated he attended all groups held today.  Lance Park  Lance Park 10/26/2016, 5:07 AM

## 2016-10-26 NOTE — Progress Notes (Signed)
Recreation Therapy Notes  Date:  10/26/16  Time: 0930 Location: [address] Dayroom  Group Topic: Stress Management  Goal Area(s) Addresses:  Patient will verbalize importance of using healthy stress management.  Patient will identify positive emotions associated with healthy stress management.   Intervention: Stress Management  Activity :  Meditation.  LRT introduced the stress management technique of meditation.  LRT played Park meditation from the Calm app to allow patients to focus on the forgiveness of others.  Patients followed along as LRT played meditation.  Education:  Stress Management, Discharge Planning.   Education Outcome: Acknowledges edcuation/In group clarification offered/Needs additional education  Clinical Observations/Feedback: Pt did not attend group.    Lance Park, LRT/CTRS         Lance Park 10/26/2016 12:21 PM 

## 2017-04-19 ENCOUNTER — Encounter (HOSPITAL_BASED_OUTPATIENT_CLINIC_OR_DEPARTMENT_OTHER): Payer: Self-pay | Admitting: Emergency Medicine

## 2017-04-19 ENCOUNTER — Emergency Department (HOSPITAL_BASED_OUTPATIENT_CLINIC_OR_DEPARTMENT_OTHER): Payer: Self-pay

## 2017-04-19 ENCOUNTER — Emergency Department (HOSPITAL_BASED_OUTPATIENT_CLINIC_OR_DEPARTMENT_OTHER)
Admission: EM | Admit: 2017-04-19 | Discharge: 2017-04-19 | Disposition: A | Discharge status: Home / Self Care | Payer: Self-pay | Attending: Emergency Medicine | Admitting: Emergency Medicine

## 2017-04-19 ENCOUNTER — Other Ambulatory Visit: Payer: Self-pay

## 2017-04-19 DIAGNOSIS — Z79899 Other long term (current) drug therapy: Secondary | ICD-10-CM | POA: Insufficient documentation

## 2017-04-19 DIAGNOSIS — W2209XA Striking against other stationary object, initial encounter: Secondary | ICD-10-CM | POA: Insufficient documentation

## 2017-04-19 DIAGNOSIS — S93601A Unspecified sprain of right foot, initial encounter: Secondary | ICD-10-CM | POA: Insufficient documentation

## 2017-04-19 DIAGNOSIS — F17210 Nicotine dependence, cigarettes, uncomplicated: Secondary | ICD-10-CM | POA: Insufficient documentation

## 2017-04-19 DIAGNOSIS — Y9389 Activity, other specified: Secondary | ICD-10-CM | POA: Insufficient documentation

## 2017-04-19 DIAGNOSIS — J45909 Unspecified asthma, uncomplicated: Secondary | ICD-10-CM | POA: Insufficient documentation

## 2017-04-19 DIAGNOSIS — Y929 Unspecified place or not applicable: Secondary | ICD-10-CM | POA: Insufficient documentation

## 2017-04-19 DIAGNOSIS — Y999 Unspecified external cause status: Secondary | ICD-10-CM | POA: Insufficient documentation

## 2017-04-19 MED ORDER — KETOROLAC TROMETHAMINE 60 MG/2ML IM SOLN
60.0000 mg | Freq: Once | INTRAMUSCULAR | Status: AC
  Administered 2017-04-19: 60 mg via INTRAMUSCULAR
  Filled 2017-04-19: qty 2

## 2017-04-19 MED ORDER — IBUPROFEN 800 MG PO TABS: 800.0000 mg | ORAL_TABLET | Freq: Three times a day (TID) | ORAL | 0 refills | Status: DC

## 2017-04-19 NOTE — ED Notes (Signed)
Patient transported to X-ray 

## 2017-04-19 NOTE — ED Triage Notes (Signed)
Pain to right foot x 2 days after doing a handstand on a dolly . No obvious injury

## 2017-04-19 NOTE — ED Provider Notes (Signed)
[facility] EMERGENCY DEPARTMENT Provider Note   CSN: [id number] Arrival date & time: 04/19/17  4098     History   Chief Complaint Chief Complaint  Patient presents with  . Foot Pain    HPI Lance Park is a 27 y.o. male.  Was doing a handstand on a fork lift and hit his foot when he fell, now has pain in lateral side of foot that seems to be going towards his ankle. Two days ago. Improving since then.   The history is provided by the patient.  Foot Pain  This is a new problem. The current episode started 2 days ago. The problem occurs constantly. The problem has been gradually improving. Pertinent negatives include no chest pain, no headaches and no shortness of breath. The symptoms are aggravated by walking. The symptoms are relieved by ice and rest. He has tried acetaminophen for the symptoms. The treatment provided mild relief.    Past Medical History:  Diagnosis Date  . Asthma    "As a child, I grew out of it"  . Depression     Patient Active Problem List   Diagnosis Date Noted  . MDD (major depressive disorder) 10/22/2016  . Severe major depression without psychotic features (HCC) 07/07/2016    History reviewed. No pertinent surgical history.      Home Medications    Prior to Admission medications   Medication Sig Start Date End Date Taking? Authorizing Provider  sertraline (ZOLOFT) 50 MG tablet Take 1 tablet (50 mg total) by mouth daily. For depression 10/27/16  Yes Armandina Stammer I, NP  traZODone (DESYREL) 50 MG tablet Take 1 tablet (50 mg total) by mouth at bedtime as needed for sleep. 10/26/16  Yes Armandina Stammer I, NP  hydrOXYzine (ATARAX/VISTARIL) 25 MG tablet Take 1 tablet (25 mg total) by mouth 3 (three) times daily as needed for anxiety. 10/26/16   Armandina Stammer I, NP  ibuprofen (ADVIL,MOTRIN) 800 MG tablet Take 1 tablet (800 mg total) by mouth 3 (three) times daily. 04/19/17   Huntleigh Doolen, Barbara Cower, MD    Family History No family history on  file.  Social History Social History   Tobacco Use  . Smoking status: Current Every Day Smoker    Packs/day: 0.50    Types: Cigarettes  . Smokeless tobacco: Never Used  Substance Use Topics  . Alcohol use: No    Frequency: Never  . Drug use: Yes    Types: Marijuana     Allergies   Patient has no known allergies.   Review of Systems Review of Systems  Respiratory: Negative for shortness of breath.   Cardiovascular: Negative for chest pain.  Neurological: Negative for headaches.  All other systems reviewed and are negative.    Physical Exam Updated Vital Signs BP 124/83 (BP Location: Right Arm)   Pulse 81   Temp 98.1 F (36.7 C) (Oral)   Resp 16   Ht  (1.753 m)   Wt 65.8 kg (145 lb)   SpO2 100%   BMI 21.41 kg/m   Physical Exam  Constitutional: He is oriented to person, place, and time. He appears well-developed and well-nourished.  HENT:  Head: Normocephalic and atraumatic.  Eyes: Conjunctivae and EOM are normal.  Neck: Normal range of motion.  Cardiovascular: Normal rate.  Pulmonary/Chest: Effort normal. No respiratory distress.  Abdominal: He exhibits no distension.  Musculoskeletal: Normal range of motion. He exhibits tenderness (lateral right foot up around lateral malleolus. no significant pain with ROM  but has some with lateral compression of metatarsals).  Neurological: He is alert and oriented to person, place, and time.  Skin: Skin is warm and dry.  Nursing note and vitals reviewed.    ED Treatments / Results  Labs (all labs ordered are listed, but only abnormal results are displayed) Labs Reviewed - No data to display  EKG None  Radiology Dg Foot Complete Right  Result Date: 04/19/2017 CLINICAL DATA:  Right foot injury.  Pain on top of right foot. EXAM: RIGHT FOOT COMPLETE - 3+ VIEW COMPARISON:  None. FINDINGS: There is no evidence of fracture or dislocation. There is no evidence of arthropathy or other focal bone abnormality. Soft  tissues are unremarkable. IMPRESSION: Negative. Electronically Signed   By: Charlett Nose M.D.   On: 04/19/2017 08:46    Procedures Procedures (including critical care time)  Medications Ordered in ED Medications  ketorolac (TORADOL) injection 60 mg (has no administration in time range)     Initial Impression / Assessment and Plan / ED Course  I have reviewed the triage vital signs and the nursing notes.  Pertinent labs & imaging results that were available during my care of the patient were reviewed by me and considered in my medical decision making (see chart for details).     X-ray reviewed by myself and radiologist.  It does seem to be some type of defect at the base of the second metatarsal however radiologist does not think it is fractured.  We will put a postop shoe and have PCP follow-up in a week if not improving in case of occult fracture.  Final Clinical Impressions(s) / ED Diagnoses   Final diagnoses:  Sprain of right foot, initial encounter    ED Discharge Orders        Ordered    ibuprofen (ADVIL,MOTRIN) 800 MG tablet  3 times daily     04/19/17 0853       Miabella Shannahan, Barbara Cower, MD 04/19/17 [phone]

## 2017-09-06 ENCOUNTER — Encounter (HOSPITAL_COMMUNITY): Payer: Self-pay

## 2017-09-06 ENCOUNTER — Emergency Department (HOSPITAL_COMMUNITY): Payer: Self-pay

## 2017-09-06 ENCOUNTER — Emergency Department (HOSPITAL_COMMUNITY)
Admission: EM | Admit: 2017-09-06 | Discharge: 2017-09-06 | Disposition: A | Discharge status: Home / Self Care | Payer: Self-pay | Attending: Emergency Medicine | Admitting: Emergency Medicine

## 2017-09-06 ENCOUNTER — Other Ambulatory Visit: Payer: Self-pay

## 2017-09-06 DIAGNOSIS — F17210 Nicotine dependence, cigarettes, uncomplicated: Secondary | ICD-10-CM | POA: Insufficient documentation

## 2017-09-06 DIAGNOSIS — Z79899 Other long term (current) drug therapy: Secondary | ICD-10-CM | POA: Insufficient documentation

## 2017-09-06 DIAGNOSIS — J181 Lobar pneumonia, unspecified organism: Secondary | ICD-10-CM | POA: Insufficient documentation

## 2017-09-06 DIAGNOSIS — J189 Pneumonia, unspecified organism: Secondary | ICD-10-CM

## 2017-09-06 MED ORDER — BENZONATATE 100 MG PO CAPS
200.0000 mg | ORAL_CAPSULE | Freq: Once | ORAL | Status: AC
  Administered 2017-09-06: 200 mg via ORAL
  Filled 2017-09-06: qty 2

## 2017-09-06 MED ORDER — LIDOCAINE HCL (PF) 1 % IJ SOLN
INTRAMUSCULAR | Status: AC
  Administered 2017-09-06: 2.1 mL
  Filled 2017-09-06: qty 5

## 2017-09-06 MED ORDER — ACETAMINOPHEN 325 MG PO TABS
ORAL_TABLET | ORAL | Status: AC
  Filled 2017-09-06: qty 2

## 2017-09-06 MED ORDER — CEFTRIAXONE SODIUM 1 G IJ SOLR
1.0000 g | Freq: Once | INTRAMUSCULAR | Status: AC
  Administered 2017-09-06: 1 g via INTRAMUSCULAR
  Filled 2017-09-06: qty 10

## 2017-09-06 MED ORDER — BENZONATATE 100 MG PO CAPS: 200.0000 mg | ORAL_CAPSULE | Freq: Three times a day (TID) | ORAL | 0 refills | Status: DC | PRN

## 2017-09-06 MED ORDER — AZITHROMYCIN 250 MG PO TABS: 250.0000 mg | ORAL_TABLET | Freq: Every day | ORAL | 0 refills | Status: DC

## 2017-09-06 MED ORDER — ACETAMINOPHEN 325 MG PO TABS
650.0000 mg | ORAL_TABLET | Freq: Once | ORAL | Status: AC
  Administered 2017-09-06: 650 mg via ORAL

## 2017-09-06 NOTE — Discharge Instructions (Signed)
Finish your antibiotics as prescribed, take 2 tablets with your first dose as instructed, then one daily for 4 additional days.  Rest, make sure you are drinking plenty of fluids. Use the tessalon for your cough symptom.

## 2017-09-06 NOTE — ED Provider Notes (Signed)
[facility] EMERGENCY DEPARTMENT Provider Note   CSN: [id number] Arrival date & time: 09/06/17  1346     History   Chief Complaint Chief Complaint  Patient presents with  . Nasal Congestion    HPI Lance Park is a 27 y.o. male presenting with a 3 day history of uri type symptoms which includes generalized body aches, fatigue, nasal congestion with clear rhinorrhea, mild sore throat which he blames on post nasal drip along with a cough which has been productive of blood tinged sputum.  He has also had low grade fever.  Symptoms do not include shortness of breath, chest pain,  Nausea, vomiting or diarrhea. He reports his girlfriend has similar symptoms which started a week ago.  He is a 1/2 ppd day smoker.  The patient has taken  Alka seltzer cold formula prior and  dayquil today prior to arrival with no significant improvement in symptoms. .  The history is provided by the patient.    Past Medical History:  Diagnosis Date  . Asthma    "As a child, I grew out of it"  . Depression     Patient Active Problem List   Diagnosis Date Noted  . MDD (major depressive disorder) 10/22/2016  . Severe major depression without psychotic features (HCC) 07/07/2016    History reviewed. No pertinent surgical history.      Home Medications    Prior to Admission medications   Medication Sig Start Date End Date Taking? Authorizing Provider  azithromycin (ZITHROMAX) 250 MG tablet Take 1 tablet (250 mg total) by mouth daily. Take first 2 tablets together, then 1 every day until finished. 09/06/17   Burgess AmorIdol, Ladeana Laplant, PA-C  benzonatate (TESSALON) 100 MG capsule Take 2 capsules (200 mg total) by mouth 3 (three) times daily as needed. 09/06/17   Burgess AmorIdol, Caldwell Kronenberger, PA-C  hydrOXYzine (ATARAX/VISTARIL) 25 MG tablet Take 1 tablet (25 mg total) by mouth 3 (three) times daily as needed for anxiety. 10/26/16   Armandina StammerNwoko, Agnes I, NP  ibuprofen (ADVIL,MOTRIN) 800 MG tablet Take 1 tablet (800 mg total) by mouth 3  (three) times daily. 04/19/17   Mesner, Barbara CowerJason, MD  sertraline (ZOLOFT) 50 MG tablet Take 1 tablet (50 mg total) by mouth daily. For depression 10/27/16   Armandina StammerNwoko, Agnes I, NP  traZODone (DESYREL) 50 MG tablet Take 1 tablet (50 mg total) by mouth at bedtime as needed for sleep. 10/26/16   Sanjuana KavaNwoko, Agnes I, NP    Family History No family history on file.  Social History Social History   Tobacco Use  . Smoking status: Current Every Day Smoker    Packs/day: 0.50    Types: Cigarettes  . Smokeless tobacco: Never Used  Substance Use Topics  . Alcohol use: No    Frequency: Never  . Drug use: Yes    Types: Marijuana     Allergies   Patient has no known allergies.   Review of Systems Review of Systems  Constitutional: Positive for fever. Negative for chills.  HENT: Positive for congestion, rhinorrhea and sore throat. Negative for ear pain, sinus pressure, trouble swallowing and voice change.   Eyes: Negative for discharge.  Respiratory: Positive for cough. Negative for chest tightness, shortness of breath, wheezing and stridor.   Cardiovascular: Negative for chest pain.  Gastrointestinal: Negative for abdominal pain.  Genitourinary: Negative.   Musculoskeletal: Positive for myalgias.  Skin: Negative.      Physical Exam Updated Vital Signs BP 122/85 (BP Location: Right Arm)   Pulse  98   Temp (!) 101.4 F (38.6 C) (Oral)   Resp 20   Wt 65.8 kg   SpO2 99%   BMI 21.41 kg/m   Physical Exam  Constitutional: He is oriented to person, place, and time. He appears well-developed and well-nourished.  HENT:  Head: Normocephalic and atraumatic.  Right Ear: Tympanic membrane and ear canal normal.  Left Ear: Tympanic membrane and ear canal normal.  Nose: Rhinorrhea present. No mucosal edema.  Mouth/Throat: Uvula is midline, oropharynx is clear and moist and mucous membranes are normal. No oropharyngeal exudate, posterior oropharyngeal edema, posterior oropharyngeal erythema or  tonsillar abscesses.  Eyes: Conjunctivae are normal.  Cardiovascular: Normal rate and normal heart sounds.  Pulmonary/Chest: Effort normal. No stridor. No respiratory distress. He has decreased breath sounds. He has no wheezes. He has no rhonchi. He has no rales. He exhibits no tenderness.  Decreased breath sounds right lung fields. No rhonchi or wheeze.  Abdominal: Soft. There is no tenderness.  Musculoskeletal: Normal range of motion.  Neurological: He is alert and oriented to person, place, and time.  Skin: Skin is warm and dry. No rash noted.  Psychiatric: He has a normal mood and affect.     ED Treatments / Results  Labs (all labs ordered are listed, but only abnormal results are displayed) Labs Reviewed - No data to display  EKG None  Radiology Dg Chest 2 View  Result Date: 09/06/2017 CLINICAL DATA:  Initial evaluation for acute cough, fever. EXAM: CHEST - 2 VIEW COMPARISON:  None. FINDINGS: Cardiac and mediastinal silhouettes are within normal limits. Lungs are normally inflated. Small focus of hazy opacity at the peripheral right upper lobe, suspicious for possible mild and/or early infiltrate given provided history. Underlying mild scattered peribronchial thickening, suspected be related history of smoking. No edema or effusion. No pneumothorax. IMPRESSION: 1. Small focus of hazy opacity within the inferior right upper lobe, suspicious for possible early/mild infiltrate given provided history. 2. Underlying scattered diffuse bronchitic changes, suspected to be related to history of smoking. Electronically Signed   By: Rise Mu M.D.   On: 09/06/2017 15:21    Procedures Procedures (including critical care time)  Medications Ordered in ED Medications  benzonatate (TESSALON) capsule 200 mg (200 mg Oral Given 09/06/17 1451)  cefTRIAXone (ROCEPHIN) injection 1 g (1 g Intramuscular Given 09/06/17 1549)  lidocaine (PF) (XYLOCAINE) 1 % injection (2.1 mLs  Given 09/06/17  1549)  acetaminophen (TYLENOL) tablet 650 mg (650 mg Oral Given 09/06/17 1611)     Initial Impression / Assessment and Plan / ED Course  I have reviewed the triage vital signs and the nursing notes.  Pertinent labs & imaging results that were available during my care of the patient were reviewed by me and considered in my medical decision making (see chart for details).     Pt with sx and cxr suggesting early CAP. He denies sob or cp, no hypoxia, prior to dc home, pt had spiked a fever, tx with tylenol. He was given IM rocephin and started on zpack. Tessalon for cough.  Advised rest, increased fluid intake, tylenol or motrin for fever,  f/u with pcp x 10 days for recheck, strict return precautions discussed.  Advised smoking cessation.  Pt appears stable for dc home.  Final Clinical Impressions(s) / ED Diagnoses   Final diagnoses:  Community acquired pneumonia of right upper lobe of lung [facility])    ED Discharge Orders         Ordered  benzonatate (TESSALON) 100 MG capsule  3 times daily PRN     09/06/17 1539    azithromycin (ZITHROMAX) 250 MG tablet  Daily     09/06/17 1539           Victoriano Lain 09/06/17 2120    Mesner, Barbara Cower, MD 09/09/17 0009

## 2017-09-06 NOTE — ED Triage Notes (Signed)
Pt is here for generalized body aches and congestion. Started 3 days ago. Has taken Alka seltzer, cold and sinus, dayquil without much relief. Has taken Dayquil today at 11am. NAD. No fever today.

## 2019-11-08 IMAGING — CR DG FOOT COMPLETE 3+V*R*
3 series · 3 of 3 positions shown · non-contrast
Comparison: None.

CLINICAL DATA: Right foot injury.  Pain on top of right foot.

EXAM:
RIGHT FOOT COMPLETE - 3+ VIEW

[t foot ap right]
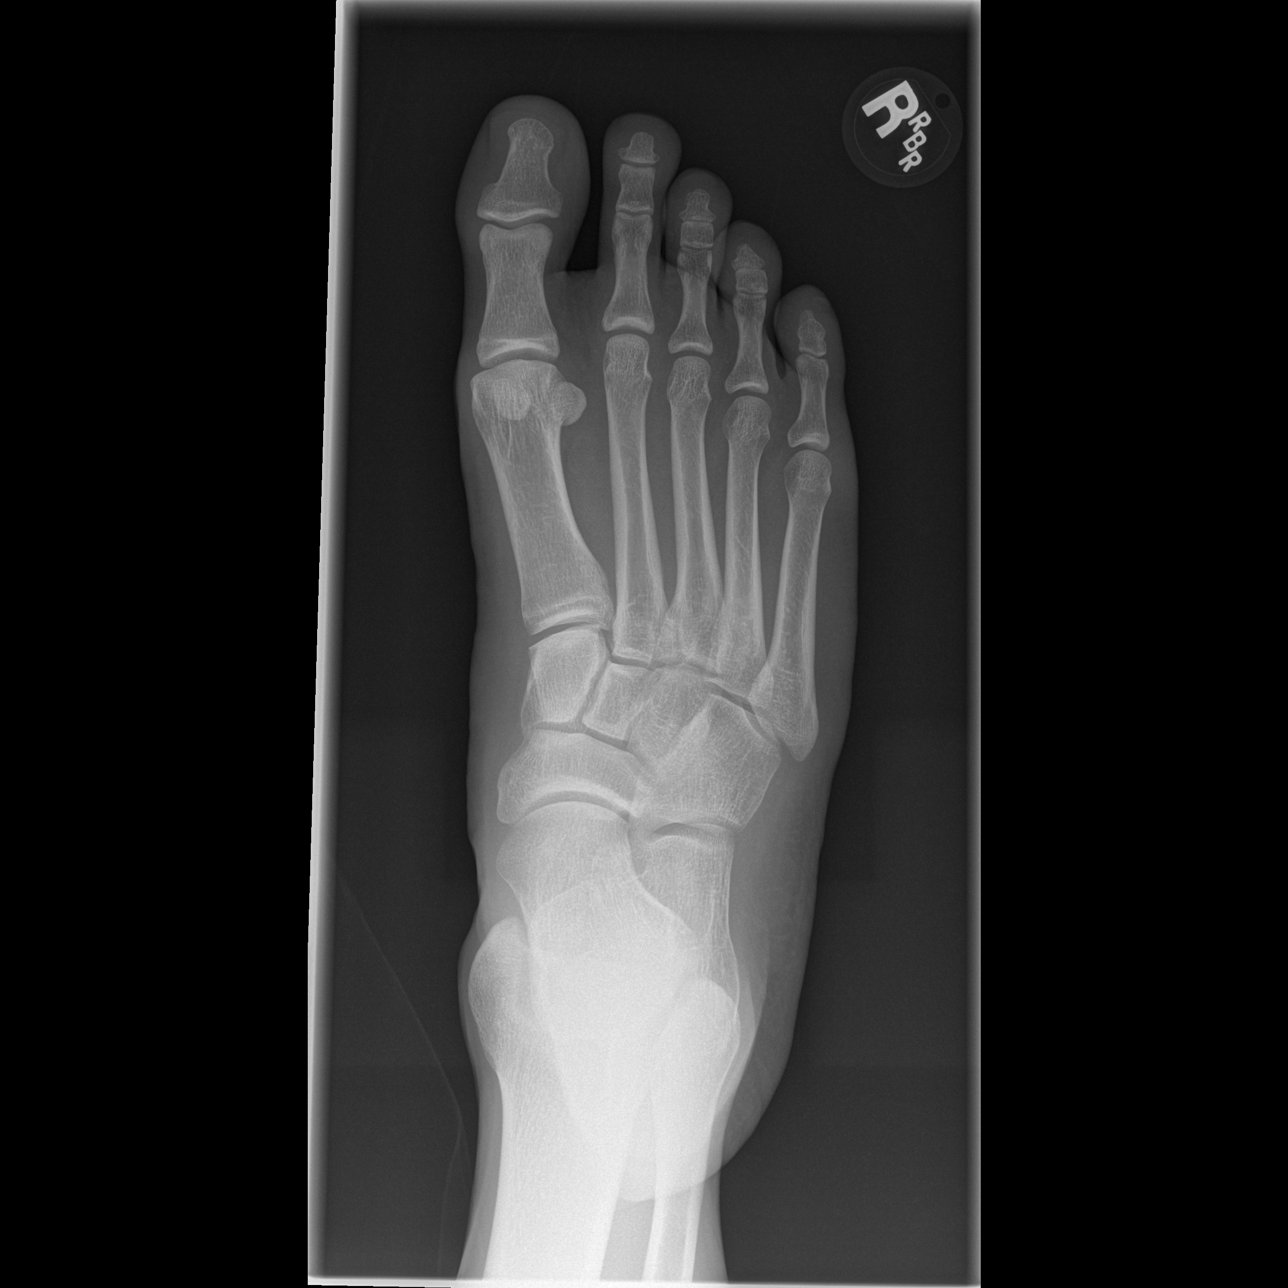

[t foot oblique right]
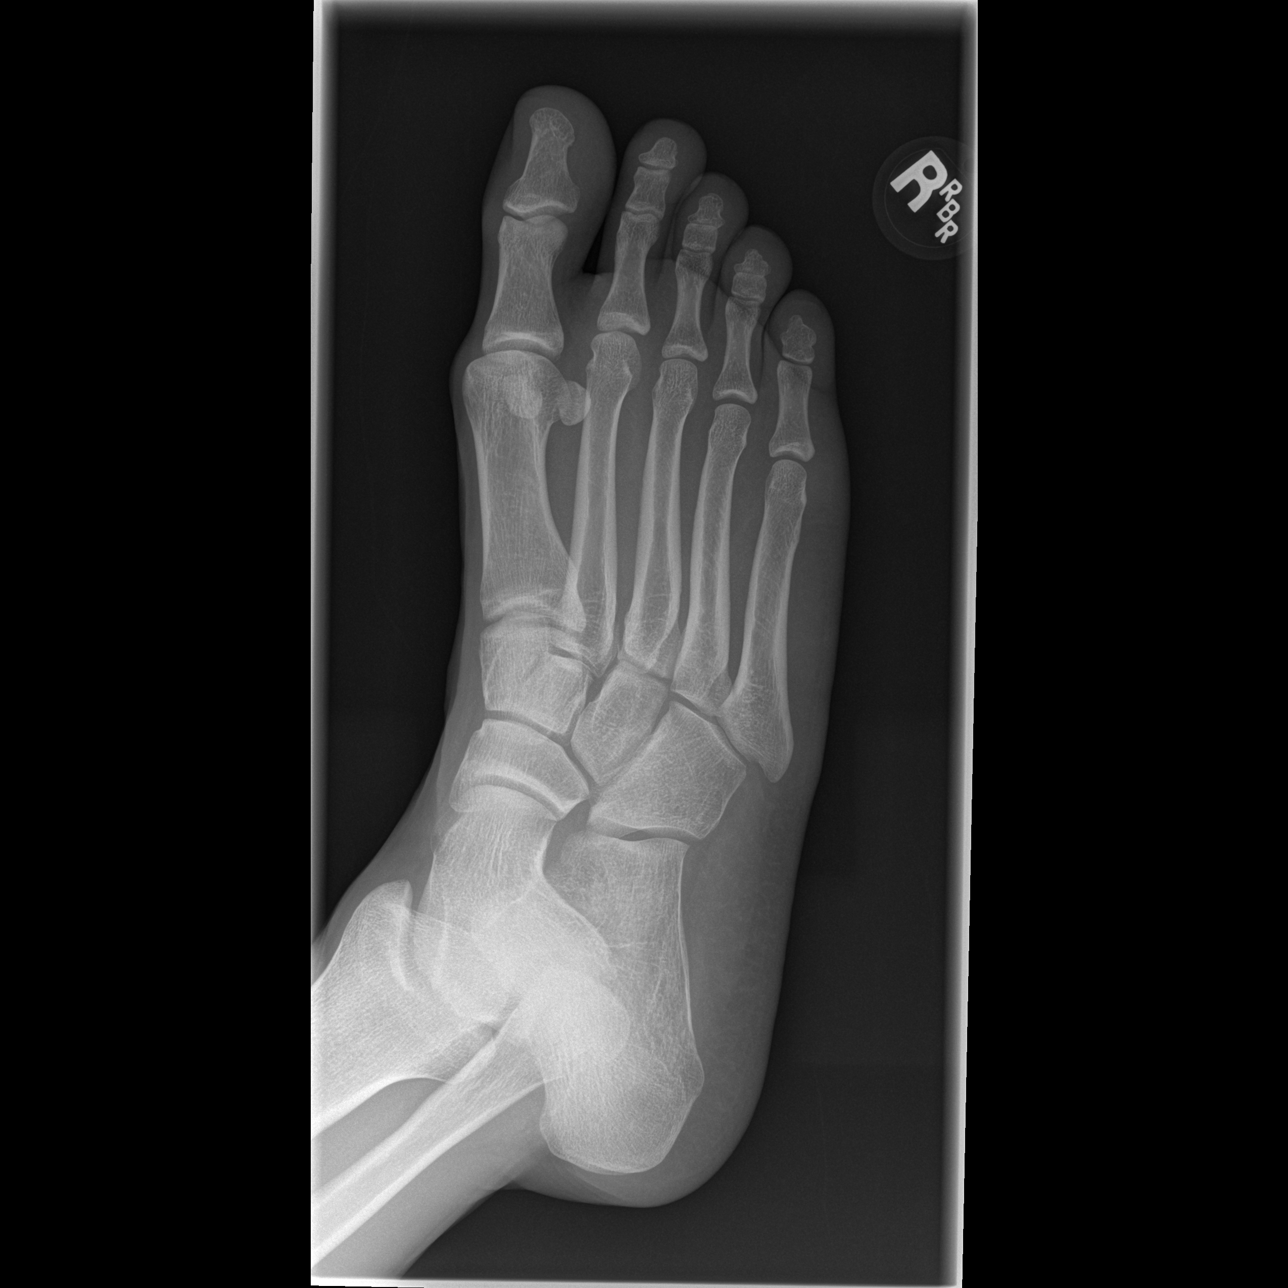

[t foot lat right]
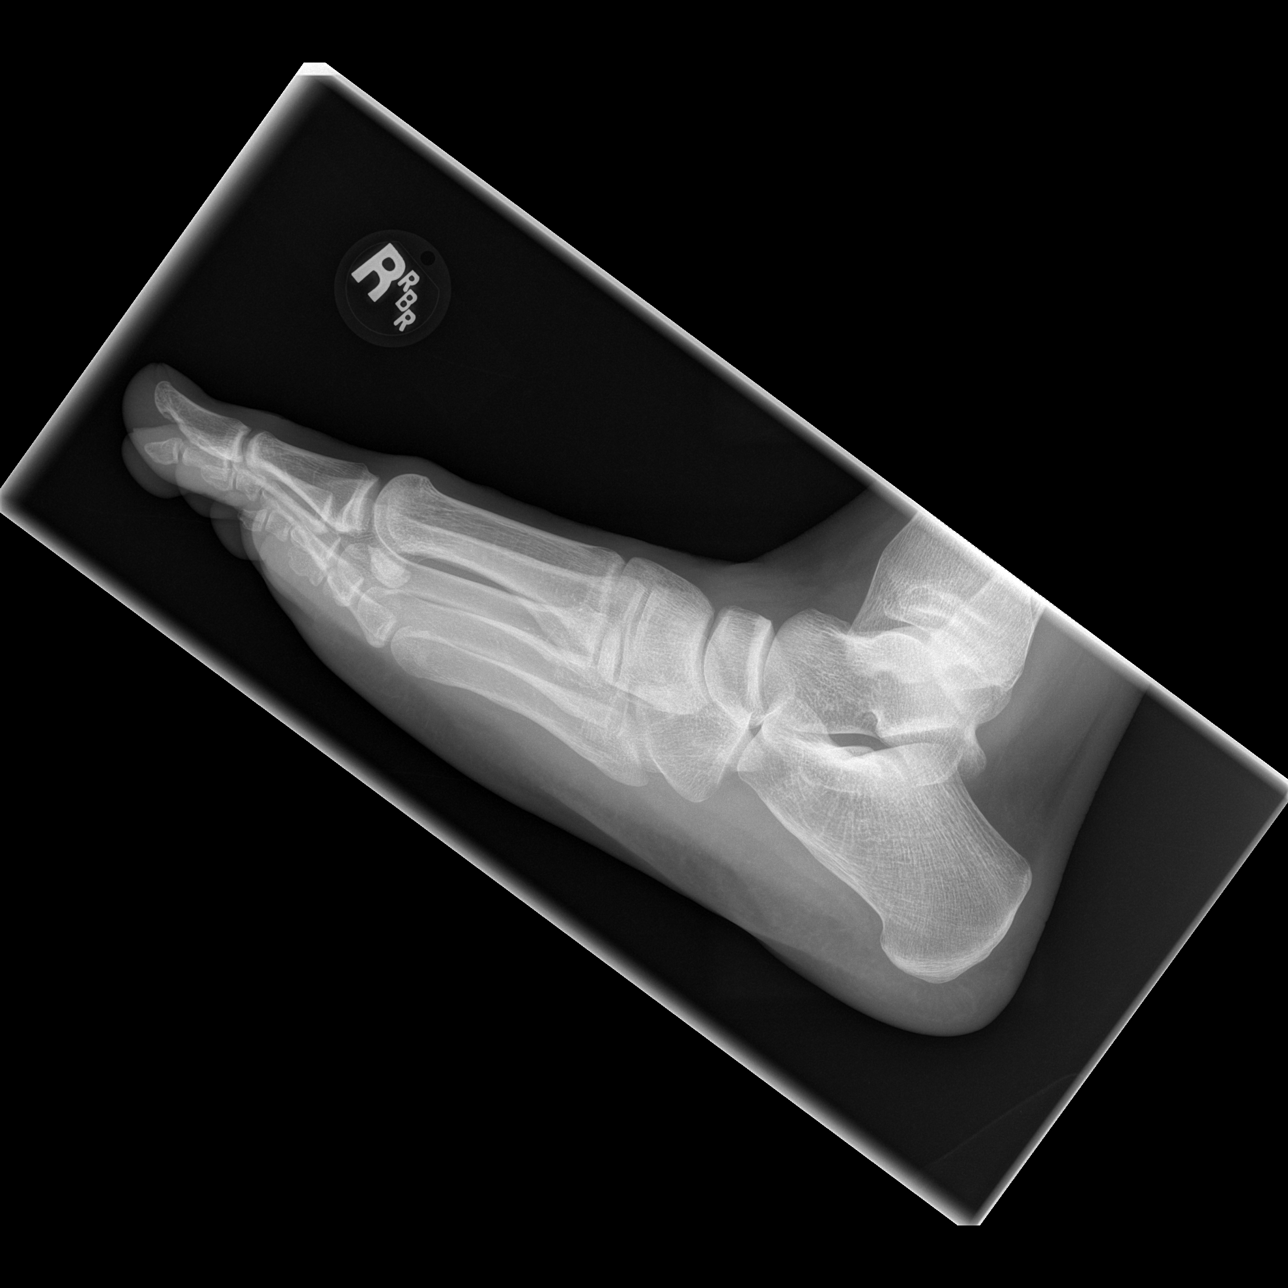

[3 of 3 positions shown; findings below may reference images not displayed]

FINDINGS: There is no evidence of fracture or dislocation. There is no
evidence of arthropathy or other focal bone abnormality. Soft
tissues are unremarkable.
IMPRESSION: Negative.

## 2020-02-12 DIAGNOSIS — F411 Generalized anxiety disorder: Secondary | ICD-10-CM | POA: Diagnosis not present

## 2020-03-03 DIAGNOSIS — Z03818 Encounter for observation for suspected exposure to other biological agents ruled out: Secondary | ICD-10-CM | POA: Diagnosis not present

## 2020-03-03 DIAGNOSIS — Z20822 Contact with and (suspected) exposure to covid-19: Secondary | ICD-10-CM | POA: Diagnosis not present

## 2020-03-27 IMAGING — DX DG CHEST 2V
2 series · 2 of 2 positions shown · non-contrast
Comparison: None.

CLINICAL DATA: Initial evaluation for acute cough, fever.

EXAM:
CHEST - 2 VIEW

[chest pa]
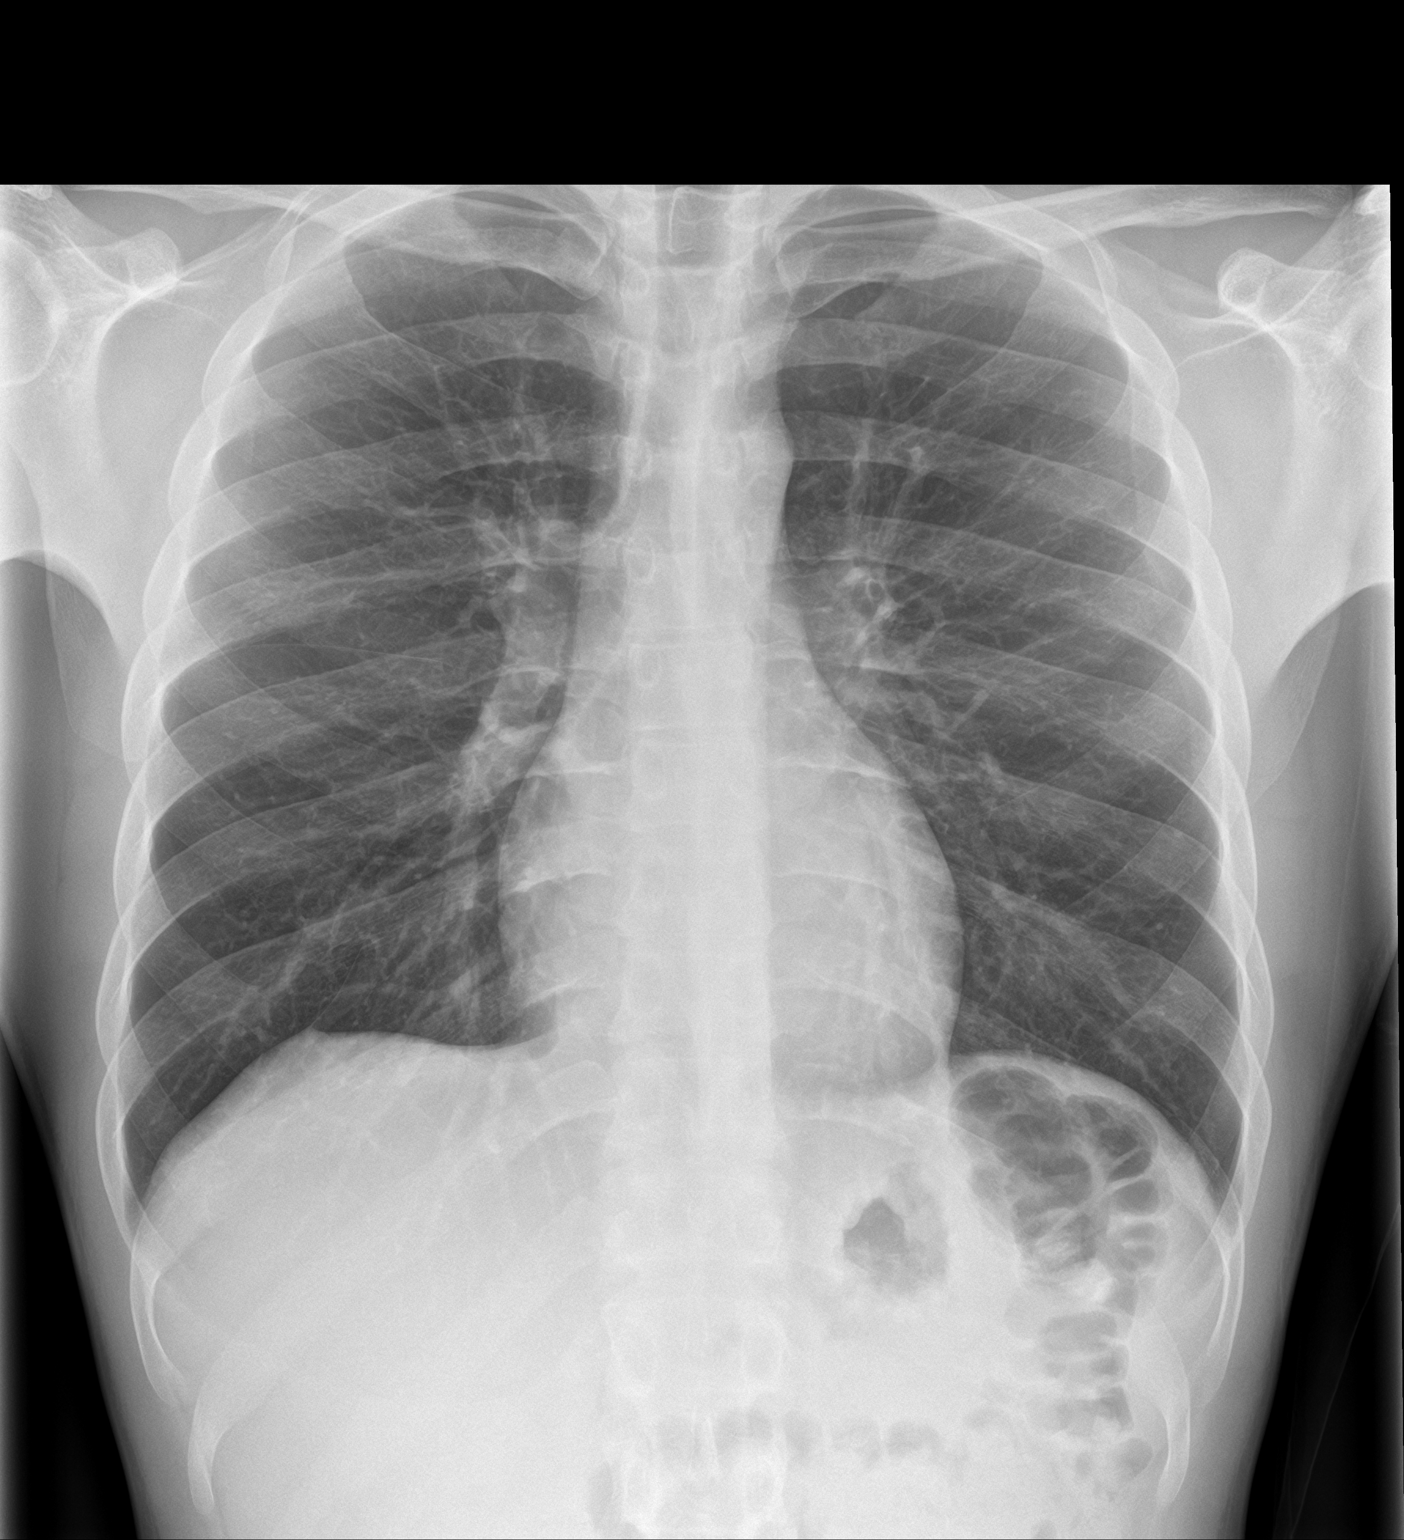

[chest lat]
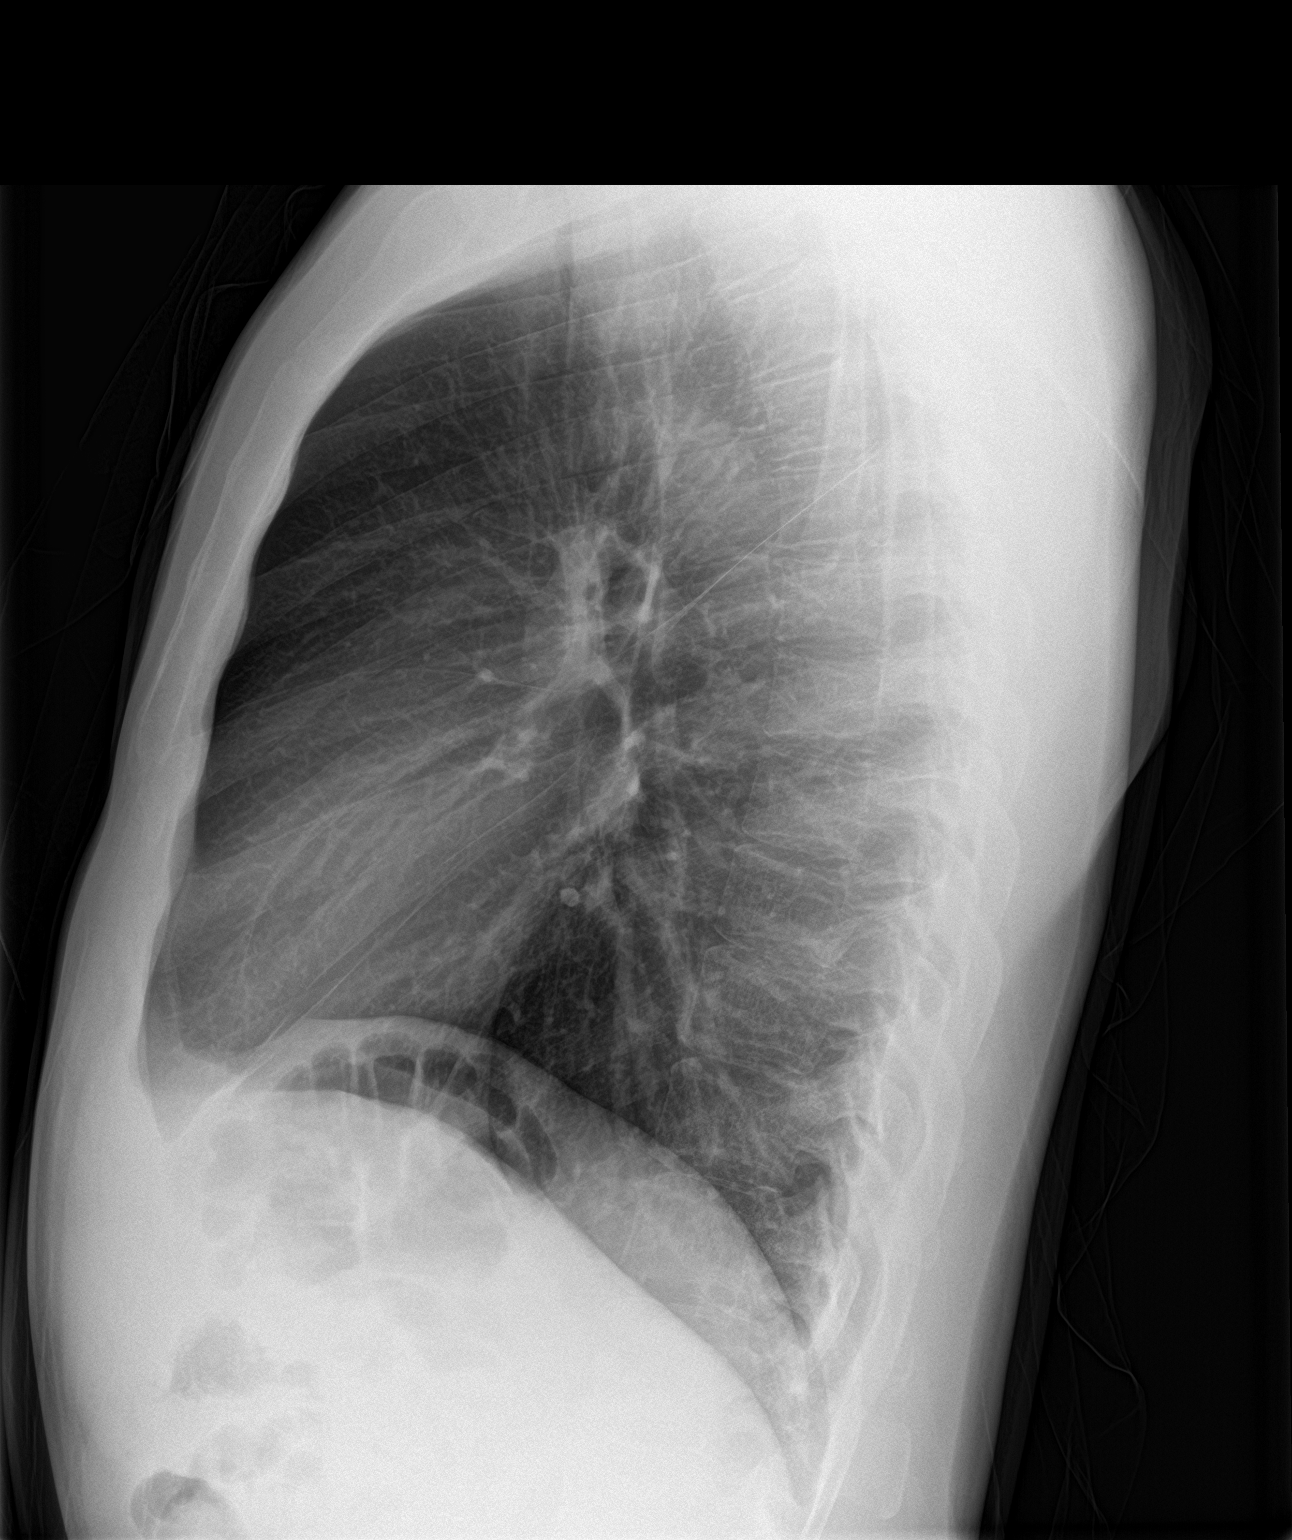

[2 of 2 positions shown; findings below may reference images not displayed]

FINDINGS: Cardiac and mediastinal silhouettes are within normal limits.

Lungs are normally inflated. Small focus of hazy opacity at the
peripheral right upper lobe, suspicious for possible mild and/or
early infiltrate given provided history. Underlying mild scattered
peribronchial thickening, suspected be related history of smoking.
No edema or effusion. No pneumothorax.
IMPRESSION: 1. Small focus of hazy opacity within the inferior right upper lobe,
suspicious for possible early/mild infiltrate given provided
history.
2. Underlying scattered diffuse bronchitic changes, suspected to be
related to history of smoking.

## 2020-05-07 ENCOUNTER — Other Ambulatory Visit (HOSPITAL_COMMUNITY)
Admission: RE | Admit: 2020-05-07 | Discharge: 2020-05-07 | Disposition: A | Discharge status: Home / Self Care | Payer: BC Managed Care – PPO | Source: Ambulatory Visit | Attending: Family Medicine | Admitting: Family Medicine

## 2020-05-07 ENCOUNTER — Ambulatory Visit (INDEPENDENT_AMBULATORY_CARE_PROVIDER_SITE_OTHER): Payer: BC Managed Care – PPO | Admitting: Family Medicine

## 2020-05-07 ENCOUNTER — Other Ambulatory Visit: Payer: Self-pay

## 2020-05-07 ENCOUNTER — Encounter (HOSPITAL_BASED_OUTPATIENT_CLINIC_OR_DEPARTMENT_OTHER): Payer: Self-pay | Admitting: Family Medicine

## 2020-05-07 ENCOUNTER — Ambulatory Visit (HOSPITAL_BASED_OUTPATIENT_CLINIC_OR_DEPARTMENT_OTHER): Payer: Self-pay | Admitting: Nurse Practitioner

## 2020-05-07 ENCOUNTER — Ambulatory Visit (HOSPITAL_BASED_OUTPATIENT_CLINIC_OR_DEPARTMENT_OTHER): Payer: Self-pay | Admitting: Family Medicine

## 2020-05-07 ENCOUNTER — Other Ambulatory Visit (HOSPITAL_BASED_OUTPATIENT_CLINIC_OR_DEPARTMENT_OTHER)
Admission: RE | Admit: 2020-05-07 | Discharge: 2020-05-07 | Disposition: A | Discharge status: Home / Self Care | Payer: BC Managed Care – PPO | Source: Ambulatory Visit | Attending: Family Medicine | Admitting: Family Medicine

## 2020-05-07 VITALS — BP 106/74 | HR 88 | Ht 69.0 in | Wt 157.6 lb

## 2020-05-07 DIAGNOSIS — Z7689 Persons encountering health services in other specified circumstances: Secondary | ICD-10-CM | POA: Diagnosis not present

## 2020-05-07 DIAGNOSIS — R079 Chest pain, unspecified: Secondary | ICD-10-CM | POA: Diagnosis not present

## 2020-05-07 DIAGNOSIS — Z113 Encounter for screening for infections with a predominantly sexual mode of transmission: Secondary | ICD-10-CM | POA: Diagnosis not present

## 2020-05-07 DIAGNOSIS — F339 Major depressive disorder, recurrent, unspecified: Secondary | ICD-10-CM

## 2020-05-07 LAB — CBC WITH DIFFERENTIAL/PLATELET
Abs Immature Granulocytes: 0.02 10*3/uL (ref 0.00–0.07)
Basophils Absolute: 0.1 10*3/uL (ref 0.0–0.1)
Basophils Relative: 1 %
Eosinophils Absolute: 0.1 10*3/uL (ref 0.0–0.5)
Eosinophils Relative: 2 %
HCT: 43.9 % (ref 39.0–52.0)
Hemoglobin: 14.4 g/dL (ref 13.0–17.0)
Immature Granulocytes: 0 %
Lymphocytes Relative: 20 %
Lymphs Abs: 1.3 10*3/uL (ref 0.7–4.0)
MCH: 25.7 pg — ABNORMAL LOW (ref 26.0–34.0)
MCHC: 32.8 g/dL (ref 30.0–36.0)
MCV: 78.3 fL — ABNORMAL LOW (ref 80.0–100.0)
Monocytes Absolute: 0.5 10*3/uL (ref 0.1–1.0)
Monocytes Relative: 8 %
Neutro Abs: 4.3 10*3/uL (ref 1.7–7.7)
Neutrophils Relative %: 69 %
Platelets: 272 10*3/uL (ref 150–400)
RBC: 5.61 MIL/uL (ref 4.22–5.81)
RDW: 13.7 % (ref 11.5–15.5)
WBC: 6.4 10*3/uL (ref 4.0–10.5)
nRBC: 0 % (ref 0.0–0.2)

## 2020-05-07 LAB — COMPREHENSIVE METABOLIC PANEL
ALT: 15 U/L (ref 0–44)
AST: 17 U/L (ref 15–41)
Albumin: 4.4 g/dL (ref 3.5–5.0)
Alkaline Phosphatase: 86 U/L (ref 38–126)
Anion gap: 7 (ref 5–15)
BUN: 12 mg/dL (ref 6–20)
CO2: 25 mmol/L (ref 22–32)
Calcium: 9.4 mg/dL (ref 8.9–10.3)
Chloride: 107 mmol/L (ref 98–111)
Creatinine, Ser: 0.94 mg/dL (ref 0.61–1.24)
GFR, Estimated: >60 mL/min (ref >60)
Glucose, Bld: 95 mg/dL (ref 70–99)
Potassium: 4 mmol/L (ref 3.5–5.1)
Sodium: 139 mmol/L (ref 135–145)
Total Bilirubin: 0.6 mg/dL (ref 0.3–1.2)
Total Protein: 7.5 g/dL (ref 6.5–8.1)

## 2020-05-07 LAB — HEPATITIS C ANTIBODY: HCV Ab: NONREACTIVE

## 2020-05-07 LAB — HIV ANTIBODY (ROUTINE TESTING W REFLEX): HIV Screen 4th Generation wRfx: NONREACTIVE

## 2020-05-07 NOTE — Assessment & Plan Note (Addendum)
Suspect related to ongoing anxiety, less likely related to cardiac etiology Cardiac exam normal in office today EKG also completed with normal sinus rhythm, normal QTC interval, no T wave abnormality Will check labs as below Recommend continued monitoring, ER precautions given Will refer to behavioral health as below If symptoms not improving with improvement in anxiety and depression, consider further evaluation to include Holter monitor or event monitor

## 2020-05-07 NOTE — Assessment & Plan Note (Signed)
Chronic problem, increase in symptoms recently Patient interested in referral to reestablish with behavioral health, referral placed ER precautions discussed

## 2020-05-07 NOTE — Progress Notes (Signed)
New Patient Office Visit  Subjective:  Patient ID: Lance Park, male    DOB: Mar 04, 1990  Age: 30 y.o. MRN: [id number]  CC:  Chief Complaint  Patient presents with  . Establish Care  . Anxiety    Patient states he suffers from anxiety and is not currently being treated. He is interested in [facility] referral  . Chest Pain    Patient states for the last 2-3 months he has had intermittent chest pain every 3-4 weeks. Patient states the pain is a pounding feeling in the center of his chest. Patient states when he is experiencing the pain it will last for about 10 seconds but will have multiple episodes throughout the day    HPI Lance L Mackie is a 30 year old male presenting to establish in clinic.  He reports current concerns of chest pain, mood related issues.  Reports past medical history significant for anxiety and depression.  Chest pain: Has been going on for about 2 months.  Notices an episode of chest pain about every 3 to 4 weeks.  Episodes will last for about 5 to 10 seconds.  Denies any prior similar episodes.  No association with activity.  Only association that patient is able to notice is related to any increase in stress or feeling of anxiety.  Only family history patient is aware of is that his father had a heart attack in his 29s.  Otherwise denies any other heart related conditions in the family or any sudden death at a young age in any family members.  Anxiety/depression: Had previously worked with behavioral health provider.  Previously was on sertraline, trazodone, hydroxyzine.  Recalls that his last visit with behavioral health was in 2018.  Has not been on medication since that time.  Reports that he has been managing well up until recently.  Has some current concerns related to anxiety and mood at present.  Past Medical History:  Diagnosis Date  . Anxiety   . Asthma    "As a child, I grew out of it"  . Depression     History reviewed. No pertinent surgical  history.  Family History  Problem Relation Age of Onset  . Lupus Mother   . Heart attack Father   . Anxiety disorder Maternal Grandfather   . Depression Paternal Grandmother   . Anxiety disorder Paternal Grandmother   . Diabetes Paternal Grandfather   . Heart attack Paternal Grandfather     Social History   Socioeconomic History  . Marital status: Single    Spouse name: Not on file  . Number of children: Not on file  . Years of education: Not on file  . Highest education level: Not on file  Occupational History  . Not on file  Tobacco Use  . Smoking status: Current Every Day Smoker    Packs/day: 0.50    Types: Cigarettes  . Smokeless tobacco: Never Used  Substance and Sexual Activity  . Alcohol use: No  . Drug use: Yes    Types: Marijuana  . Sexual activity: Yes  Other Topics Concern  . Not on file  Social History Narrative  . Not on file   Social Determinants of Health   Financial Resource Strain: Not on file  Food Insecurity: Not on file  Transportation Needs: Not on file  Physical Activity: Not on file  Stress: Not on file  Social Connections: Not on file  Intimate Partner Violence: Not on file    Objective:   Today's Vitals:  BP 106/74   Pulse 88   Ht 5\' 9"  (1.753 m)   Wt 157 lb 9.6 oz (71.5 kg)   SpO2 96%   BMI 23.27 kg/m   Physical Exam  30 year old male in no acute distress Cardiovascular exam with regular rate and rhythm, no murmurs appreciated Lungs clear to auscultation bilaterally  Assessment & Plan:   Problem List Items Addressed This Visit      Other   MDD (major depressive disorder)    Chronic problem, increase in symptoms recently Patient interested in referral to reestablish with behavioral health, referral placed ER precautions discussed      Relevant Orders   CBC with Differential/Platelet   Comprehensive metabolic panel   Ambulatory referral to Psychiatry   Chest pain    Suspect related to ongoing anxiety, less likely  related to cardiac etiology Cardiac exam normal in office today EKG also completed with normal sinus rhythm, normal QTC interval, no T wave abnormality Will check labs as below Recommend continued monitoring, ER precautions given Will refer to behavioral health as below If symptoms not improving with improvement in anxiety and depression, consider further evaluation to include Holter monitor or event monitor      Relevant Orders   CBC with Differential/Platelet   Comprehensive metabolic panel   EKG 12-Lead    Other Visit Diagnoses    Encounter to establish care with new doctor    -  Primary   Relevant Orders   CBC with Differential/Platelet   Comprehensive metabolic panel   Routine screening for STI (sexually transmitted infection)       Relevant Orders   RPR   Urine cytology ancillary only   Hepatitis C antibody   HIV Antibody (routine testing w rflx)    Patient interested in STI screening today, orders placed as above  Outpatient Encounter Medications as of 05/07/2020  Medication Sig  . [DISCONTINUED] azithromycin (ZITHROMAX) 250 MG tablet Take 1 tablet (250 mg total) by mouth daily. Take first 2 tablets together, then 1 every day until finished.  . [DISCONTINUED] benzonatate (TESSALON) 100 MG capsule Take 2 capsules (200 mg total) by mouth 3 (three) times daily as needed.  . [DISCONTINUED] hydrOXYzine (ATARAX/VISTARIL) 25 MG tablet Take 1 tablet (25 mg total) by mouth 3 (three) times daily as needed for anxiety.  . [DISCONTINUED] ibuprofen (ADVIL,MOTRIN) 800 MG tablet Take 1 tablet (800 mg total) by mouth 3 (three) times daily.  . [DISCONTINUED] sertraline (ZOLOFT) 50 MG tablet Take 1 tablet (50 mg total) by mouth daily. For depression  . [DISCONTINUED] traZODone (DESYREL) 50 MG tablet Take 1 tablet (50 mg total) by mouth at bedtime as needed for sleep.   No facility-administered encounter medications on file as of 05/07/2020.   Spent 45 minutes on this patient encounter,  including preparation, chart review, face-to-face counseling with patient and coordination of care, and documentation of encounter  Follow-up: Return in about 2 months (around 07/07/2020).  Follow-up on behavioral health referral as well as episodes of chest pain.  Kamaury Cutbirth J De 07/09/2020, MD

## 2020-05-07 NOTE — Patient Instructions (Signed)
  Medication Instructions:  Your physician recommends that you continue on your current medications as directed. Please refer to the Current Medication list given to you today. --If you need a refill on any your medications before your next appointment, please call your pharmacy first. If no refills are authorized on file call the office.--  Lab Work: Your physician has recommended that you have lab work today: STI Panel, CBC, and Comprehensive Metabolic Panel If you have labs (blood work) drawn today and your tests are completely normal, you will receive your results only by: Marland Kitchen MyChart Message (if you have MyChart) OR . A phone call from our staff. Please ensure you check your voicemail in the event that you authorized detailed messages to be left on a delegated number. If you have any lab test that is abnormal or we need to change your treatment, we will call you to review the results.  Referrals/Procedures/Imaging: A referral has been placed for you to Psychiatry for evaluation and treatment. Someone from the scheduling department will be in contact with you in regards to coordinating your consultation. If you do not hear from any of the schedulers within 7-10 business days please give our office a call.  Follow-Up: Your next appointment:   Your physician recommends that you schedule a follow-up appointment in: 2 MONTHS with Dr. de Peru  Thanks for letting us be apart of your health journey!!  Primary Care and Sports Medicine   Dr. de Peru and Shawna Clamp, DNP, AGNP  We recommend signing up for the patient portal called "MyChart".  Sign up information is provided on this After Visit Summary.  MyChart is used to connect with patients for Virtual Visits (Telemedicine).  Patients are able to view lab/test results, encounter notes, upcoming appointments, etc.  Non-urgent messages can be sent to your provider as well.   To learn more about what you can do with MyChart, please visit --   [email or web address].

## 2020-05-08 LAB — RPR: RPR Ser Ql: NONREACTIVE

## 2020-05-08 LAB — URINE CYTOLOGY ANCILLARY ONLY
Chlamydia: NEGATIVE
Comment: NEGATIVE
Comment: NORMAL
Neisseria Gonorrhea: NEGATIVE

## 2020-05-11 ENCOUNTER — Telehealth (HOSPITAL_BASED_OUTPATIENT_CLINIC_OR_DEPARTMENT_OTHER): Payer: Self-pay

## 2020-05-11 NOTE — Telephone Encounter (Signed)
Results released by Dr. de [place] and reviewed by patient via MyChart Instructed patient to contact the office with any questions or concerns.  

## 2020-05-11 NOTE — Telephone Encounter (Signed)
-----   Message from Hosie Poisson Peru, MD sent at 05/11/2020 12:27 PM EDT ----- Lance Park blood cell and red blood cell counts are normal with normal hemoglobin.  Electrolytes, kidney function and liver function are normal.  Screening for HIV, hepatitis C, syphilis, gonorrhea and chlamydia are all negative.

## 2020-06-04 ENCOUNTER — Other Ambulatory Visit: Payer: Self-pay

## 2020-06-04 ENCOUNTER — Telehealth (HOSPITAL_COMMUNITY): Payer: Self-pay | Admitting: Psychiatry

## 2020-06-04 ENCOUNTER — Ambulatory Visit (INDEPENDENT_AMBULATORY_CARE_PROVIDER_SITE_OTHER): Payer: BC Managed Care – PPO | Admitting: Clinical

## 2020-06-04 DIAGNOSIS — F419 Anxiety disorder, unspecified: Secondary | ICD-10-CM

## 2020-06-04 DIAGNOSIS — F331 Major depressive disorder, recurrent, moderate: Secondary | ICD-10-CM | POA: Diagnosis not present

## 2020-06-04 DIAGNOSIS — F1220 Cannabis dependence, uncomplicated: Secondary | ICD-10-CM | POA: Diagnosis not present

## 2020-06-04 NOTE — Telephone Encounter (Signed)
D:  Lance Dandy, LCSW referred pt to [facility].  A:  Placed call to orient pt, but there was no answer.  Left vm for pt to call case manager back.  Inform Darren.

## 2020-06-04 NOTE — Progress Notes (Signed)
Comprehensive Clinical Assessment (CCA) Note  06/04/2020 Lance Park [id number]  Chief Complaint:  Chief Complaint  Patient presents with  . Anxiety  . Depression   Visit Diagnosis: Moderate episode of major depressive disorder    Anxiety    Moderate Cannabis use disorder       CCA Screening, Triage and Referral (STR)  Patient Reported Information How did you hear about [place]? Primary Care  Referral name: Dr. Sinclair Ship  Referral phone number: No data recorded  Whom do you see for routine medical problems? Primary Care  Practice/Facility Name: No data recorded Practice/Facility Phone Number: No data recorded Name of Contact: Sinclair Ship  Contact Number: No data recorded Contact Fax Number: No data recorded Prescriber Name: No data recorded Prescriber Address (if known): No data recorded  What Is the Reason for Your Visit/Call Today? Depression and anxiety  How Long Has This Been Causing You Problems? > than 6 months (Began in middle school, worsened in 2010 while in college)  What Do You Feel Would Help You the Most Today? No data recorded  Have You Recently Been in Any Inpatient Treatment (Hospital/Detox/Crisis Center/28-Day Program)? No (2018 hospitalized 2x for SI. Pt denies any other hospitalizations.)  Name/Location of Program/Hospital: [facility] How Long Were You There? No data recorded When Were You Discharged? No data recorded  Have You Ever Received Services From [facility] Before? No Who Do You See at [facility]? No data recorded  Have You Recently Had Any Thoughts About Hurting Yourself? Yes (Pt reports passive SI but denies plan or intent to harm self.)  Are You Planning to Commit Suicide/Harm Yourself At This time? No   Have you Recently Had Thoughts About Hurting Someone Karolee Ohs? No  Explanation: No data recorded  Have You Used Any Alcohol or Drugs in the Past 24 Hours? Yes  How Long Ago Did You Use Drugs or Alcohol? Last  night What Did You Use and How Much? Marijuana, 2 blunts   Do You Currently Have a Therapist/Psychiatrist? No (Pt says he previously saw Dr. Carollee Massed in 2017-2018. Pt says during last hospitalization prescribed zoloft but stopped taking it, provided no reason as to why he stopped.)  Name of Therapist/Psychiatrist: No data recorded  Have You Been Recently Discharged From Any Office Practice or Programs? No data recorded Explanation of Discharge From Practice/Program: No data recorded    CCA Screening Triage Referral Assessment Type of Contact: Face-to-Face  Is this Initial or Reassessment? No data recorded Date Telepsych consult ordered in [facility]:  No data recorded Time Telepsych consult ordered in [facility]:  No data recorded  Patient Reported Information Reviewed? No data recorded Patient Left Without Being Seen? No data recorded Reason for Not Completing Assessment: No data recorded  Collateral Involvement: No data recorded  Does Patient Have a Court Appointed Legal Guardian? No Name and Contact of Legal Guardian: No data recorded If Minor and Not Living with Parent(s), Who has Custody? No data recorded Is CPS involved or ever been involved? No data recorded Is APS involved or ever been involved? No data recorded  Patient Determined To Be At Risk for Harm To Self or Others Based on Review of Patient Reported Information or Presenting Complaint? No  Method: No data recorded Availability of Means: No data recorded Intent: No data recorded Notification Required: No data recorded Additional Information for Danger to Others Potential: No data recorded Additional Comments for Danger to Others Potential: No data recorded Are There Guns or  Other Weapons in Your Home? No, pt denies access. Types of Guns/Weapons: No data recorded Are These Weapons Safely Secured?                            No data recorded Who Could Verify You Are Able To Have These Secured: No data recorded Do You  Have any Outstanding Charges, Pending Court Dates, Parole/Probation? No data recorded Contacted To Inform of Risk of Harm To Self or Others: No data recorded  Location of Assessment: -- ([facility] [place])   Does Patient Present under Involuntary Commitment? No data recorded IVC Papers Initial File Date: No data recorded  [place] of Residence: [place]   Patient Currently Receiving the Following Services: Not Receiving Services   Determination of Need: Routine (7 days)   Options For Referral: Outpatient Therapy; Medication Management     CCA Biopsychosocial Intake/Chief Complaint:  Hx depression and anxiety  Current Symptoms/Problems: Fatigue, unmotivated, poor sleep pattern, irritability, apathetic, passive SI, constant worry, loneliness, self medicates with marijuana   Patient Reported Schizophrenia/Schizoaffective Diagnosis in Past: No   Strengths: "Im not sure"  Preferences: None reported  Abilities: Willingness to participate in therapy and med mgmt   Type of Services Patient Feels are Needed: outpatient treatment  Initial Clinical Notes/Concerns: Pt reports a hx of depression and anxiety. Pt reports last psychiatric hospitalization in 2018. Pt says he experiences passive SI but denies plan, intent to harm self or others. No self harming behavior reported. Pt report self medicates with marijuana use, denies any other drug or alcohol use. Writer discussed with pt potential benefits of participating in group therapy to assist with establishing support group. Pt agreeable to do so; Clinical research associate referred him to [employer]) to discuss participation in MHIOP. Additionally, pt provided with list of crisis resources and encouraged to call 911 or go to his closest emergency dept in the event of an emergency. Front Information systems manager provided him with list of psychiatrist for medication management.Pt informed if he misses 3 consecutive appts will result in termination; if no follow up  appt has been scheduled in 90 days or longer can result in termination.  Mental Health Symptoms Depression:  Change in energy/activity; Sleep (too much or little); Fatigue; Irritability; Hopelessness; Worthlessness   Duration of Depressive symptoms: Greater than two weeks   Mania:  Racing thoughts; Irritability   Anxiety:   Worrying; Tension; Sleep; Irritability; Restlessness; Fatigue (Worries-finances, family, loneliness)   Psychosis:  None   Duration of Psychotic symptoms: No data recorded  Trauma:  None   Obsessions:  None   Compulsions:  None   Inattention:  None   Hyperactivity/Impulsivity:  N/A   Oppositional/Defiant Behaviors:  None   Emotional Irregularity:  Chronic feelings of emptiness; Recurrent suicidal behaviors/gestures/threats; Intense/inappropriate anger; Unstable self-image   Other Mood/Personality Symptoms:  No data recorded   Mental Status Exam Appearance and self-care  Stature:  Tall   Weight:  Average weight   Clothing:  Casual   Grooming:  Normal   Cosmetic use:  None   Posture/gait:  Normal   Motor activity:  Not Remarkable   Sensorium  Attention:  Normal   Concentration:  Normal   Orientation:  X5   Recall/memory:  Normal   Affect and Mood  Affect:  Constricted   Mood:  Depressed; Anxious   Relating  Eye contact:  Normal   Facial expression:  Responsive  Attitude toward examiner:  Cooperative   Thought  and Language  Speech flow: Clear and Coherent; Normal   Thought content:  Appropriate to Mood and Circumstances   Preoccupation:  None   Hallucinations:  None   Organization:  No data recorded  Affiliated Computer ServicesExecutive Functions  Fund of Knowledge:  Good   Intelligence:  Average   Abstraction:  Normal   Judgement:  Good   Reality Testing:  Adequate   Insight:  Good   Decision Making:  Normal   Social Functioning  Social Maturity:  Responsible   Social Judgement:  Normal   Stress  Stressors:  Family conflict;  Financial   Coping Ability:  Overwhelmed; Exhausted (Pt says he smokes marijuana to cope with stressors)   Skill Deficits:  Activities of daily living; Communication   Supports:  Support needed     Religion: Religion/Spirituality Are You A Religious Person?: Yes What is Your Religious Affiliation?: Christian  Leisure/Recreation: Leisure / Recreation Do You Have Hobbies?: Yes Leisure and Hobbies: Plays piano and drums  Exercise/Diet: Exercise/Diet Do You Exercise?: Yes What Type of Exercise Do You Do?: Run/Walk How Many Times a Week Do You Exercise?: 1-3 times a week Have You Gained or Lost A Significant Amount of Weight in the Past Six Months?: No Do You Follow a Special Diet?: No Do You Have Any Trouble Sleeping?: Yes Explanation of Sleeping Difficulties: Difficulty falling asleep, pt says he usually avg 6 hrs when not experiencing racing thoughts. Pt says for the past 2 nights unable to rest. Works Fri-Sun 3rd shift. Pt encouraged to look into OTC sleep aids, agreeable to do so.   CCA Employment/Education Employment/Work Situation: Employment / Work Situation Employment situation: Employed Where is patient currently employed?: [employer] How long has patient been employed?: 2 yrs [name] job has been impacted by current illness: No Has patient ever been in the [employer] and Companymilitary?: No  Education: Education Is Patient Currently Attending School?: No Did Garment/textile technologistYou Graduate From [employer] School?: Yes Did Theme park managerYou Attend College?: Yes What Type of College Degree Do you Have?: Attended [employer], didnt receive degree Did You Attend Graduate School?: No Did You Have An Individualized Education Program (IIEP): No Did You Have Any Difficulty At School?: No [name] Education Has Been Impacted by Current Illness: No   CCA Family/Childhood History Family and Relationship History: Family history Marital status: Single Does patient have children?: No  Childhood History:  Childhood  History By whom was/is the patient raised?: Mother Description of patient's relationship with caregiver when they were a child: "I always felt loved but felt she had closer relationship with my sister" [name] description of current relationship with people who raised him/her: "I always felt loved but felt she had closer relationship with my sister" Does patient have siblings?: Yes Number of Siblings: 1 Description of patient's current relationship with siblings: Older sister, good relationship Did patient suffer any verbal/emotional/physical/sexual abuse as a child?: No Did patient suffer from severe childhood neglect?: No Has patient ever been sexually abused/assaulted/raped as an adolescent or adult?: No Was the patient ever a victim of a crime or a disaster?: No Witnessed domestic violence?: No Has patient been affected by domestic violence as an adult?: No  Child/Adolescent Assessment:     CCA Substance Use Alcohol/Drug Use: Alcohol / Drug Use Pain Medications: See Mar Prescriptions: Pt denies Over the Counter: See Mar History of alcohol / drug use?: Yes Longest period of sobriety (when/how long): 2016-2018 Negative Consequences of Use: Financial Withdrawal Symptoms: Agitation,Irritability,Other (Comment) (Unmotivated) Substance #1 Name of Substance 1: Marijuana  1 - Age of First Use: 14 1 - Amount (size/oz): 1/8 per day 1 - Frequency: Daily 1 - Duration: Ongoing 1 - Last Use / Amount: Last night 1 - Method of Aquiring: Purchase 1- Route of Use: Oral                       ASAM's:  Six Dimensions of Multidimensional Assessment  Dimension 1:  Acute Intoxication and/or Withdrawal Potential:      Dimension 2:  Biomedical Conditions and Complications:      Dimension 3:  Emotional, Behavioral, or Cognitive Conditions and Complications:     Dimension 4:  Readiness to Change:     Dimension 5:  Relapse, Continued use, or Continued Problem Potential:      Dimension 6:  Recovery/Living Environment:     ASAM Severity Score:    ASAM Recommended Level of Treatment:     Substance use Disorder (SUD) Substance Use Disorder (SUD)  Checklist Symptoms of Substance Use: Evidence of tolerance,Substance(s) often taken in larger amounts or over longer times than was intended,Persistent desire or unsuccessful efforts to cut down or control use  Recommendations for Services/Supports/Treatments: Recommendations for Services/Supports/Treatments Recommendations For Services/Supports/Treatments: Individual Therapy,Medication Management, MHIOP  DSM5 Diagnoses: Patient Active Problem List   Diagnosis Date Noted  . Chest pain 05/07/2020  . MDD (major depressive disorder) 10/22/2016  . Severe major depression without psychotic features (HCC) 07/07/2016    Patient Centered Plan: Patient is on the following Treatment Plan(s):  Anxiety and Depression   Referrals to Alternative Service(s): Referred to Alternative Service(s):   Place:   Date:   Time:    Referred to Alternative Service(s):   Place:   Date:   Time:    Referred to Alternative Service(s):   Place:   Date:   Time:    Referred to Alternative Service(s):   Place:   Date:   Time:     Suzan Slick, LCSW

## 2020-06-05 ENCOUNTER — Telehealth (HOSPITAL_COMMUNITY): Payer: Self-pay | Admitting: Psychiatry

## 2020-06-05 NOTE — Telephone Encounter (Signed)
D:  Returned pt's call.  A:  Oriented pt.  Pt is hesitant about MH-IOP.  "I prefer one on one with my therapist Darren."  Case manager highlighted the pros of group as opposed to 1:1.  Encouraged pt to verify his benefits and think about it.  Recommended pt to ask for case manager next time he sees Lowella Dandy, [place], if he has further questions about MH-IOP.  Inform Darren.  R:  Pt receptive.

## 2020-06-11 ENCOUNTER — Ambulatory Visit (INDEPENDENT_AMBULATORY_CARE_PROVIDER_SITE_OTHER): Payer: BC Managed Care – PPO | Admitting: Clinical

## 2020-06-11 ENCOUNTER — Other Ambulatory Visit: Payer: Self-pay

## 2020-06-11 DIAGNOSIS — F1220 Cannabis dependence, uncomplicated: Secondary | ICD-10-CM | POA: Diagnosis not present

## 2020-06-11 DIAGNOSIS — F419 Anxiety disorder, unspecified: Secondary | ICD-10-CM | POA: Diagnosis not present

## 2020-06-11 DIAGNOSIS — F331 Major depressive disorder, recurrent, moderate: Secondary | ICD-10-CM

## 2020-06-12 NOTE — Progress Notes (Signed)
   THERAPIST PROGRESS NOTE  Session Time: 4pm  Participation Level: Active  Behavioral Response: CasualAlertAnxious  Type of Therapy: Individual Therapy  Treatment Goals addressed: Coping  Interventions: Other: psychoeducation  Summary: Lance Park is a 31 y.o. male who presents in anxious manner. Pt describes mood as "anxious but overall content" Pt reports he spoke with Jeri Modena but declines MH-IOP at this time. Pt says he prefers individual therapy. Pt states he has not felt like working toward treatment goals. Pt reports since last session his days have consisted of him "laying around, sleeping and smoking" Pt states he has felt unmotivated to do anything else.  Suicidal/Homicidal: Pt denies SI/HI no plan or intent to harm self or others reported. Therapist Response: CSW assessed for changes in mood, behavior and daily functioning. CSW reviewed and provided pt with information on the cycle of depression. CSW discussed with pt healthy coping skills. CSW informed pt of potential benefits of support group and to let writer know if he reconsiders offer to participate in group therapy. Pt says he is agreeable to do so. Plan: Return again in 1 weeks.  Diagnosis: Axis I: Moderate episode major depressive disorder    Anxiety    Moderate cannabis use disorder    Axis II: No diagnosis    Suzan Slick, LCSW 06/12/2020

## 2020-06-18 ENCOUNTER — Ambulatory Visit (HOSPITAL_COMMUNITY): Payer: BC Managed Care – PPO | Admitting: Clinical

## 2020-06-18 ENCOUNTER — Other Ambulatory Visit: Payer: Self-pay

## 2020-07-02 ENCOUNTER — Ambulatory Visit (INDEPENDENT_AMBULATORY_CARE_PROVIDER_SITE_OTHER): Payer: BC Managed Care – PPO | Admitting: Clinical

## 2020-07-02 ENCOUNTER — Other Ambulatory Visit: Payer: Self-pay

## 2020-07-02 DIAGNOSIS — F1220 Cannabis dependence, uncomplicated: Secondary | ICD-10-CM | POA: Diagnosis not present

## 2020-07-02 DIAGNOSIS — F419 Anxiety disorder, unspecified: Secondary | ICD-10-CM

## 2020-07-02 DIAGNOSIS — F331 Major depressive disorder, recurrent, moderate: Secondary | ICD-10-CM

## 2020-07-02 NOTE — Progress Notes (Signed)
   THERAPIST PROGRESS NOTE  Session Time: 1pm  Participation Level: Active  Behavioral Response: Casual and NeatAlertpleasant  Type of Therapy: Individual Therapy  Treatment Goals addressed: Coping  Interventions: CBT  Summary: Lance Park is a 30 y.o. male who presents in a pleasant mood. Pt demonstrates insight as he is able to recognize when he displays irrational thinking but says he has difficulty challenging irrational beliefs. Pt reports his ex-partner has entered back into his life. Pt says their relationship is unhealthy and he does not want to let it go go due to believing he will not find another partner. Pt states he continues to use marijuana as a coping method to manage stress.   Suicidal/Homicidal: Pt denies SI/HI no plan or intent to harm self or others reported.  Therapist Response: CSW assessed for changes in mood and behavior. CSW challenged pt irrational beliefs as he discussed catastrophisizing events and jumping to conclusions. CSW explained to pt what cognitive distortions are and provided him with information on socratic questions to challenge irrational beliefs. CSW assisted pt in identifying strengths he possesses and encourages him to work on implementing daily self care to improve mood.  Plan: Return again in 2 weeks.  Diagnosis: Axis I: Moderate episode major depressive disorder                                     Anxiety                                     Moderate cannabis use disorder    Axis II: No diagnosis    Suzan Slick, LCSW 07/02/2020

## 2020-07-07 ENCOUNTER — Ambulatory Visit (HOSPITAL_BASED_OUTPATIENT_CLINIC_OR_DEPARTMENT_OTHER): Payer: BC Managed Care – PPO | Admitting: Family Medicine

## 2020-07-07 ENCOUNTER — Encounter (HOSPITAL_BASED_OUTPATIENT_CLINIC_OR_DEPARTMENT_OTHER): Payer: Self-pay

## 2020-07-09 ENCOUNTER — Other Ambulatory Visit: Payer: Self-pay

## 2020-07-09 ENCOUNTER — Ambulatory Visit (INDEPENDENT_AMBULATORY_CARE_PROVIDER_SITE_OTHER): Payer: BC Managed Care – PPO | Admitting: Clinical

## 2020-07-09 DIAGNOSIS — F419 Anxiety disorder, unspecified: Secondary | ICD-10-CM

## 2020-07-09 DIAGNOSIS — F331 Major depressive disorder, recurrent, moderate: Secondary | ICD-10-CM | POA: Diagnosis not present

## 2020-07-09 DIAGNOSIS — F1220 Cannabis dependence, uncomplicated: Secondary | ICD-10-CM

## 2020-07-09 NOTE — Progress Notes (Signed)
   THERAPIST PROGRESS NOTE  Session Time: 3pm  Participation Level: Active  Behavioral Response: CasualAlert"stable"  Type of Therapy: Individual Therapy  Treatment Goals addressed: Coping  Interventions: CBT Virtual Visit via Video Note  I connected with Lance Park on 07/09/20 at  3:00 PM EDT by a video enabled telemedicine application and verified that I am speaking with the correct person using two identifiers.  Location: Patient: home Provider: office   I discussed the limitations of evaluation and management by telemedicine and the availability of in person appointments. The patient expressed understanding and agreed to proceed.   I discussed the assessment and treatment plan with the patient. The patient was provided an opportunity to ask questions and all were answered. The patient agreed with the plan and demonstrated an understanding of the instructions.   The patient was advised to call back or seek an in-person evaluation if the symptoms worsen or if the condition fails to improve as anticipated.  I provided 40 minutes of non-face-to-face time during this encounter.   Summary: Lance Park is a 30 y.o. male who describes his mood as "stable". Pt discussed concerns about relationship with ex-partner. Pt describes relationship as "toxic' but questions why he still wants to be in the relationship. Pt states a fear of being alone and says ex-partner is a person he communicates with regularly. Pt demonstrates some insight and acknowledges challenge of making sound decisions as it relates to ex-partner. Pt reports not having boundaries in the relationship.  Suicidal/Homicidal: Pt denies SI/HI no plan or intent to harm self or others reported.  Therapist Response: CSW assessed for changes in mood, behavior and daily functioning. CSW discussed with pt establishing personal boundaries. CSW probed for feedback on what his values and limits are in a relationship. CSW prompted pt  to identify cost vs benefit ratio in current relationship with ex-partner.  Plan: Return again in 1 weeks.  Diagnosis: Axis I: Moderate episode major depressive disorder                                     Anxiety                                     Moderate cannabis use disorder    Axis II: No diagnosis    Suzan Slick, LCSW 07/09/2020

## 2020-07-15 ENCOUNTER — Encounter (HOSPITAL_BASED_OUTPATIENT_CLINIC_OR_DEPARTMENT_OTHER): Payer: Self-pay | Admitting: Family Medicine

## 2020-07-16 ENCOUNTER — Other Ambulatory Visit: Payer: Self-pay

## 2020-07-16 ENCOUNTER — Ambulatory Visit (INDEPENDENT_AMBULATORY_CARE_PROVIDER_SITE_OTHER): Payer: BC Managed Care – PPO | Admitting: Clinical

## 2020-07-16 DIAGNOSIS — F1220 Cannabis dependence, uncomplicated: Secondary | ICD-10-CM | POA: Diagnosis not present

## 2020-07-16 DIAGNOSIS — F419 Anxiety disorder, unspecified: Secondary | ICD-10-CM

## 2020-07-16 DIAGNOSIS — F331 Major depressive disorder, recurrent, moderate: Secondary | ICD-10-CM

## 2020-07-16 NOTE — Progress Notes (Signed)
   THERAPIST PROGRESS NOTE  Session Time: 3pm  Participation Level: Active  Behavioral Response: Casual and NeatAlertEuthymic  Type of Therapy: Individual Therapy  Treatment Goals addressed: Coping  Interventions: CBT  Summary: Lance Park is a 30 y.o. male who presents in pleasant mood. Pt appeared to be proud of himself as he discussed ending the relationship with ex-partner. Pt acknowledges relationship was unhealthy. Also, pt says he attended a family bbq and reports he plans to engage in more social activities.   Suicidal/Homicidal: Pt denies SI/HI no plan or intent to harm self or others reported.  Therapist Response: CSW assessed for changes in mood and behavior. CSW reviewed with pt cost vs benefit in relationships and traits of healthy vs unhealthy relationships. CSW continues to encourage pt to enage in daily activities that promote his health and wellness.  Plan: Return again in 2 weeks.  Diagnosis: Axis I: Moderate episode major depressive disorder                                     Anxiety                                     Moderate cannabis use disorder    Axis II: No diagnosis    Suzan Slick, LCSW 07/16/2020

## 2020-07-23 ENCOUNTER — Other Ambulatory Visit: Payer: Self-pay

## 2020-07-23 ENCOUNTER — Ambulatory Visit (HOSPITAL_COMMUNITY): Payer: BC Managed Care – PPO | Admitting: Clinical

## 2020-07-30 ENCOUNTER — Other Ambulatory Visit: Payer: Self-pay

## 2020-07-30 ENCOUNTER — Ambulatory Visit (INDEPENDENT_AMBULATORY_CARE_PROVIDER_SITE_OTHER): Payer: BC Managed Care – PPO | Admitting: Clinical

## 2020-07-30 DIAGNOSIS — F1220 Cannabis dependence, uncomplicated: Secondary | ICD-10-CM

## 2020-07-30 DIAGNOSIS — F331 Major depressive disorder, recurrent, moderate: Secondary | ICD-10-CM

## 2020-07-30 DIAGNOSIS — F419 Anxiety disorder, unspecified: Secondary | ICD-10-CM

## 2020-07-30 NOTE — Progress Notes (Signed)
   THERAPIST PROGRESS NOTE  Session Time: 3pm  Participation Level: Active  Behavioral Response: Casual and NeatAlertpleasant  Type of Therapy: Individual Therapy  Treatment Goals addressed: Coping  Interventions: CBT and Strength-based  Summary: Lance Park is a 30 y.o. male who presents in a pleasant mood. Pt reports minimal effort made to implement self care routine. Pt acknowledges he needs to do so but reports inconsistencies when things become challenging. Pt reports he would like to broaden his circle of support but states he has not made any additional effort to do so. Pt demonstrates good insight and admits becoming discouraged when things become challenging.   Suicidal/Homicidal: Pt denies SI/HI no plan or intent to harm self or others reported.  Therapist Response: CSW assessed for changes in mood, behavior and daily functioning. Homework assignment=track days he has implemented self care routine. CSW discussed with pt self care tips and healthy coping methods to manage life stressors. CSW processed with pt what exposure therapy is and how it can be used to help face fear.  Plan: Return again in 2 weeks.  Diagnosis: Axis I: Moderate episode major depressive disorder                                     Anxiety                                     Moderate cannabis use disorder      Axis II: No diagnosis    Suzan Slick, LCSW 07/30/2020

## 2020-08-06 ENCOUNTER — Other Ambulatory Visit: Payer: Self-pay

## 2020-08-06 ENCOUNTER — Ambulatory Visit (INDEPENDENT_AMBULATORY_CARE_PROVIDER_SITE_OTHER): Payer: BC Managed Care – PPO | Admitting: Clinical

## 2020-08-06 DIAGNOSIS — F331 Major depressive disorder, recurrent, moderate: Secondary | ICD-10-CM | POA: Diagnosis not present

## 2020-08-06 DIAGNOSIS — F1220 Cannabis dependence, uncomplicated: Secondary | ICD-10-CM

## 2020-08-06 DIAGNOSIS — F419 Anxiety disorder, unspecified: Secondary | ICD-10-CM

## 2020-08-06 NOTE — Progress Notes (Signed)
   THERAPIST PROGRESS NOTE  Session Time: 3pm  Participation Level: Active  Behavioral Response: Casual and NeatAlertpleasant  Type of Therapy: Individual Therapy  Treatment Goals addressed: Coping  Interventions: Supportive  Summary: Lance Park is a 30 y.o. male who presents in pleasant mood. Pt reports he went to visit family and friends and enjoyed spending time with them. Pt says he plans to work on staying connected to support group especially when experiencing changes in mood. Pt acknowledges his marijuana dependence and reports he is unable to sleep if he does not smoke. Pt also discussed the financial consequences of his marijuana addiction. Pt says he self medicates with marijuana to quiet the racing thoughts he has. However, pt says in 2016-2018 he stopped smoking marijuana and was more socially active. Pt states he would like to get back to meeting new people and being more socially active.  Suicidal/Homicidal: Pt denies SI/HI no plan or intent to harm self or others reported.  Therapist Response: CSW assessed for changes in mood and behavior. CSW actively listened as pt discussed marijuana dependence and financial hardship it has caused. CSW brainstormed with pt healthy coping methods to manage life stressors. CSW emphasized to pt importance of being consistent with applying coping methods. CSW modeled for pt how to reframe automatic negative thoughts and discussed using positive affirmations to help uplift mood. HW-track self care routine  Plan: Return again in 2 weeks.  Diagnosis: Axis I: Moderate episode major depressive disorder                                     Anxiety    Cannabis used disorder, severe dependence                                         Axis II: No diagnosis    Suzan Slick, LCSW 08/06/2020

## 2020-08-13 ENCOUNTER — Other Ambulatory Visit: Payer: Self-pay

## 2020-08-13 ENCOUNTER — Ambulatory Visit (HOSPITAL_COMMUNITY): Payer: BC Managed Care – PPO | Admitting: Clinical

## 2020-08-20 ENCOUNTER — Ambulatory Visit (HOSPITAL_COMMUNITY): Payer: BC Managed Care – PPO | Admitting: Clinical

## 2020-08-20 ENCOUNTER — Other Ambulatory Visit: Payer: Self-pay

## 2020-08-27 ENCOUNTER — Ambulatory Visit (INDEPENDENT_AMBULATORY_CARE_PROVIDER_SITE_OTHER): Payer: BC Managed Care – PPO | Admitting: Clinical

## 2020-08-27 ENCOUNTER — Other Ambulatory Visit: Payer: Self-pay

## 2020-08-27 DIAGNOSIS — F1220 Cannabis dependence, uncomplicated: Secondary | ICD-10-CM | POA: Diagnosis not present

## 2020-08-27 DIAGNOSIS — F331 Major depressive disorder, recurrent, moderate: Secondary | ICD-10-CM | POA: Diagnosis not present

## 2020-08-27 DIAGNOSIS — F419 Anxiety disorder, unspecified: Secondary | ICD-10-CM

## 2020-08-27 NOTE — Progress Notes (Signed)
   THERAPIST PROGRESS NOTE  Session Time: 3pm  Participation Level: Active  Behavioral Response: CasualAlertpleasant  Type of Therapy: Individual Therapy  Treatment Goals addressed: Coping  Interventions: Supportive  Summary: Lance Park is a 30 y.o. male who reports he did not complete homework assignment(track self care routine). Pt reports he has been experiencing low mood and feeling unmotivated. Pt continues to report self medicating with marijuana use. Pt says he has been experiencing financial hardship due to marijuana use and neglecting to pay household expenses. Per pt report he spends $400 per month on marijuana. Pt states he has made recent attempts to quit marijuana use cold [place] or weaning himself off but has had no success. Writer discussed with pt potential benefits of participating in [facility] and referred pt to Fulton Reek, group facilitator. Pt says he is agreeable to participate in program.  Suicidal/Homicidal: Pt denies SI/HI no plan or intent to harm self or others reported.  Therapist Response: CSW actively listened as pt discussed effort to stop using marijuana and financial hardship it has caused him. CSW discussed with pt referral to [facility] and potential benefits of receiving group therapy and individual therapy with a substance abuse counselor. CSW also discussed with pt participating in NA/AA support group regularly. CSW probed for feedback on cost vs benefit of marijuana use and assisted pt with calculating total amount of money spent on marijuana.  Plan: Return again in 1 weeks.  Diagnosis: Axis I: Moderate episode major depressive disorder                                     Anxiety                                     Cannabis used disorder, severe dependence    Axis II: No diagnosis    Suzan Slick, LCSW 08/27/2020

## 2020-09-03 ENCOUNTER — Ambulatory Visit (HOSPITAL_COMMUNITY): Payer: BC Managed Care – PPO | Admitting: Clinical

## 2020-09-10 ENCOUNTER — Other Ambulatory Visit: Payer: Self-pay

## 2020-09-10 ENCOUNTER — Ambulatory Visit (HOSPITAL_COMMUNITY): Payer: BC Managed Care – PPO | Admitting: Clinical

## 2020-09-17 ENCOUNTER — Other Ambulatory Visit: Payer: Self-pay

## 2020-09-17 ENCOUNTER — Ambulatory Visit (HOSPITAL_COMMUNITY): Payer: BC Managed Care – PPO | Admitting: Clinical

## 2020-09-24 ENCOUNTER — Other Ambulatory Visit: Payer: Self-pay

## 2020-09-24 ENCOUNTER — Ambulatory Visit (HOSPITAL_COMMUNITY): Payer: BC Managed Care – PPO | Admitting: Clinical

## 2020-10-01 ENCOUNTER — Ambulatory Visit (HOSPITAL_COMMUNITY): Payer: BC Managed Care – PPO | Admitting: Clinical

## 2020-10-08 ENCOUNTER — Ambulatory Visit (HOSPITAL_COMMUNITY): Payer: BC Managed Care – PPO | Admitting: Clinical

## 2022-09-05 ENCOUNTER — Encounter (HOSPITAL_COMMUNITY): Payer: Self-pay

## 2022-09-05 ENCOUNTER — Ambulatory Visit (HOSPITAL_COMMUNITY)
Admission: EM | Admit: 2022-09-05 | Discharge: 2022-09-05 | Disposition: A | Discharge status: Home / Self Care | Payer: BC Managed Care – PPO | Attending: Nurse Practitioner | Admitting: Nurse Practitioner

## 2022-09-05 DIAGNOSIS — S0003XA Contusion of scalp, initial encounter: Secondary | ICD-10-CM

## 2022-09-05 DIAGNOSIS — S0001XA Abrasion of scalp, initial encounter: Secondary | ICD-10-CM

## 2022-09-05 DIAGNOSIS — M542 Cervicalgia: Secondary | ICD-10-CM

## 2022-09-05 MED ORDER — CYCLOBENZAPRINE HCL 5 MG PO TABS: 5.0000 mg | ORAL_TABLET | Freq: Two times a day (BID) | ORAL | 0 refills | Status: AC | PRN

## 2022-09-05 MED ORDER — IBUPROFEN 800 MG PO TABS: 800.0000 mg | ORAL_TABLET | Freq: Three times a day (TID) | ORAL | 0 refills | Status: AC | PRN

## 2022-09-05 NOTE — ED Notes (Signed)
Pt reports his last tetanus shot was about 4-7 years ago.

## 2022-09-05 NOTE — Discharge Instructions (Addendum)
As we discussed, the neck pain is likely musculoskeletal.  Take ibuprofen 800 mg every 8 hours as needed for pain.  You can also take the Flexeril up to 2 times daily as needed for muscular pain.  Start light range of motion/twitching exercises.  Seek care in the emergency room if your symptoms worsen.  Apply cool compresses to your scalp at least a few times daily to help with swelling and pain.  Keep the abrasion clean and dry, you can apply thin layer of ointment like Vaseline or Aquaphor over top of it to help with healing if you want to.
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
## 2023-10-27 ENCOUNTER — Other Ambulatory Visit (HOSPITAL_BASED_OUTPATIENT_CLINIC_OR_DEPARTMENT_OTHER): Payer: Self-pay

## 2023-10-27 MED ORDER — FLUZONE 0.5 ML IM SUSY
0.5000 mL | PREFILLED_SYRINGE | Freq: Once | INTRAMUSCULAR | 0 refills | Status: AC
  Filled 2023-10-27: qty 0.5, 1d supply, fill #0
